# Patient Record
Sex: Female | Born: 1975
Health system: Southern US, Community
[De-identification: ages and names within clinical notes are randomized; demographics above are authoritative.]

---

## 2001-08-23 ENCOUNTER — Encounter: Payer: Self-pay | Admitting: *Deleted

## 2001-08-23 ENCOUNTER — Emergency Department (HOSPITAL_COMMUNITY): Admission: EM | Admit: 2001-08-23 | Discharge: 2001-08-23 | Payer: Self-pay | Admitting: *Deleted

## 2001-12-24 ENCOUNTER — Ambulatory Visit (HOSPITAL_COMMUNITY): Admission: RE | Admit: 2001-12-24 | Discharge: 2001-12-24 | Payer: Self-pay | Admitting: Pulmonary Disease

## 2001-12-30 ENCOUNTER — Ambulatory Visit (HOSPITAL_COMMUNITY): Admission: RE | Admit: 2001-12-30 | Discharge: 2001-12-30 | Payer: Self-pay | Admitting: Pulmonary Disease

## 2002-01-24 ENCOUNTER — Ambulatory Visit (HOSPITAL_COMMUNITY): Admission: RE | Admit: 2002-01-24 | Discharge: 2002-01-24 | Payer: Self-pay | Admitting: Internal Medicine

## 2003-03-09 ENCOUNTER — Other Ambulatory Visit: Admission: RE | Admit: 2003-03-09 | Discharge: 2003-03-09 | Payer: Self-pay | Admitting: Dermatology

## 2003-05-01 ENCOUNTER — Ambulatory Visit (HOSPITAL_COMMUNITY): Admission: RE | Admit: 2003-05-01 | Discharge: 2003-05-01 | Payer: Self-pay | Admitting: Internal Medicine

## 2003-05-01 ENCOUNTER — Encounter: Payer: Self-pay | Admitting: Internal Medicine

## 2004-02-26 ENCOUNTER — Ambulatory Visit (HOSPITAL_COMMUNITY): Admission: RE | Admit: 2004-02-26 | Discharge: 2004-02-26 | Payer: Self-pay | Admitting: Internal Medicine

## 2004-02-29 ENCOUNTER — Ambulatory Visit (HOSPITAL_COMMUNITY): Admission: RE | Admit: 2004-02-29 | Discharge: 2004-02-29 | Payer: Self-pay | Admitting: Internal Medicine

## 2004-03-01 ENCOUNTER — Ambulatory Visit (HOSPITAL_COMMUNITY): Admission: RE | Admit: 2004-03-01 | Discharge: 2004-03-01 | Payer: Self-pay | Admitting: Internal Medicine

## 2004-03-04 ENCOUNTER — Ambulatory Visit (HOSPITAL_COMMUNITY): Admission: RE | Admit: 2004-03-04 | Discharge: 2004-03-04 | Payer: Self-pay | Admitting: Internal Medicine

## 2004-11-29 ENCOUNTER — Other Ambulatory Visit: Admission: RE | Admit: 2004-11-29 | Discharge: 2004-11-29 | Payer: Self-pay | Admitting: Obstetrics & Gynecology

## 2004-12-18 HISTORY — PX: PARTIAL HYSTERECTOMY: SHX80

## 2005-03-09 ENCOUNTER — Encounter (INDEPENDENT_AMBULATORY_CARE_PROVIDER_SITE_OTHER): Payer: Self-pay | Admitting: *Deleted

## 2005-03-09 ENCOUNTER — Observation Stay (HOSPITAL_COMMUNITY): Admission: RE | Admit: 2005-03-09 | Discharge: 2005-03-10 | Payer: Self-pay | Admitting: Obstetrics & Gynecology

## 2006-08-27 ENCOUNTER — Ambulatory Visit: Payer: Self-pay | Admitting: Gastroenterology

## 2007-03-06 ENCOUNTER — Ambulatory Visit (HOSPITAL_COMMUNITY): Admission: RE | Admit: 2007-03-06 | Discharge: 2007-03-06 | Payer: Self-pay | Admitting: Obstetrics & Gynecology

## 2008-04-22 ENCOUNTER — Ambulatory Visit (HOSPITAL_COMMUNITY): Admission: RE | Admit: 2008-04-22 | Discharge: 2008-04-22 | Payer: Self-pay | Admitting: Family Medicine

## 2011-03-08 ENCOUNTER — Other Ambulatory Visit (HOSPITAL_COMMUNITY): Payer: Self-pay | Admitting: Family Medicine

## 2011-03-08 ENCOUNTER — Ambulatory Visit (HOSPITAL_COMMUNITY)
Admission: RE | Admit: 2011-03-08 | Discharge: 2011-03-08 | Disposition: A | Discharge status: Home / Self Care | Payer: BC Managed Care – PPO | Source: Ambulatory Visit | Attending: Family Medicine | Admitting: Family Medicine

## 2011-03-08 DIAGNOSIS — M792 Neuralgia and neuritis, unspecified: Secondary | ICD-10-CM

## 2011-03-08 DIAGNOSIS — M5020 Other cervical disc displacement, unspecified cervical region: Secondary | ICD-10-CM | POA: Insufficient documentation

## 2011-03-08 DIAGNOSIS — M542 Cervicalgia: Secondary | ICD-10-CM | POA: Insufficient documentation

## 2011-05-05 NOTE — Procedures (Signed)
NAMENIANNA, IGO                          ACCOUNT NO.:  [id number]   MEDICAL RECORD NO.:  [id number]                   PATIENT TYPE:  OUT   LOCATION:  RESP                                 FACILITY:  [facility]   PHYSICIAN:  Edward L. Juanetta Gosling, M.D.             DATE OF BIRTH:  [birth year]   DATE OF PROCEDURE:  DATE OF DISCHARGE:  03/04/2004                              PULMONARY FUNCTION TEST   IMPRESSION:  1. Spirometry is normal.  2. Lung volumes show questionable air trapping, but normal total lung     capacity.  3. DLCO is normal.  4. Arterial blood gases are normal.      ___________________________________________                                            Oneal Deputy. Juanetta Gosling, M.D.   ELH/MEDQ  D:  03/07/2004  T:  03/08/2004  Job:  [id number]   cc:   Madelin Rear. Sherwood Gambler, M.D.  [address]  [place]  [place] [address]  Fax: [phone]

## 2011-05-05 NOTE — H&P (Signed)
[name], [name]                ACCOUNT NO.:  [id number]   MEDICAL RECORD NO.:  [id number]          PATIENT TYPE:  AMB   LOCATION:  SDC                           FACILITY:  [facility]   PHYSICIAN:  Freddy Finner, M.D.   DATE OF BIRTH:  27-Nov-1976   DATE OF ADMISSION:  03/09/2005  DATE OF DISCHARGE:                                HISTORY & PHYSICAL   ADMITTING DIAGNOSES:  1.  Probable adenomyosis with myometrial thickening on sonohysterogram.  2.  Long history of menorrhagia, severe dysmenorrhea.  3.  History of abnormal Pap.   The patient is a 35 year old, white, married female, gravida 3, para 2, who  first presented to my office in September 2005.  At that time she complained  of chronic severe pelvic cramping pain and history of very heavy menses with  passage of large clots with menses.  By history this has been improved  somewhat with oral contraceptives but her regular medical doctor had asked  her to discontinue the oral contraceptives.  Clinically her examination in  the office was normal without any appreciable enlargement of the uterus or  palpable masses.  A sonohysterogram in the office, however, showed  myometrial thickening with the anterior wall 1.7-cm and posterior wall 2.1-  cm in thickness.  There was no intracavitary mass after a saline infusion.  The endometrium was thin at 1.6-mm.  Due to the persistence of her symptoms,  options of therapy have been discussed with the patient including  laparoscopy and NovaSure endometrial ablation.  She does not require  contraception having had a tubal ligation in the past.  She has requested  definitive surgical intervention and is admitted now for laparoscopic  assisted vaginal hysterectomy.   CURRENT REVIEW OF SYSTEMS:  There are no cardiopulmonary symptoms.  She does  complain of some GI distress with post prandial dumping characterized by  diarrhea after meals.  She has no other GU complaints.   PAST MEDICAL HISTORY:  No  known significant medical illnesses.   PREVIOUS SURGICAL PROCEDURES:  Tubal ligation done in 1998 after the birth  of her second child.   She has two living children.  She had one spontaneous miscarriage before  conceiving and delivering two viable infants.  She has never had a blood  transfusion.  She is not a cigarette smoker.  She is currently not on any  medications on a chronic basis.   FAMILY HISTORY:  Remarkable for bone cancer in her mother.  Her father has  cancer of un described type.   PHYSICAL EXAMINATION:  HEENT:  Grossly within normal limits.  Thyroid gland  is not palpably enlarged.  VITAL SIGNS:  Blood pressure in the office 110/76, height is 5 feet 4.5  inches, weight 138 pounds.  CHEST:  Clear to auscultation.  HEART:  Normal sinus rhythm without murmurs, rubs or gallops.  BREASTS:  Considered to be normal.  No palpable masses.  No skin change or  nipple discharge.  ABDOMEN:  Soft and nontender without appreciable organomegaly or palpable  masses.  PELVIC:  Normal external genitalia, vagina,  and cervix.  Clinically a  bimanual exam reveals no palpable abnormalities.  RECTAL:  The rectum is palpably normal and rectovaginal exam confirms these  findings. Sonohysterogram in the office did reveal thickening of the  anterior and posterior myometrial walls consistent with adenomyosis.  EXTREMITIES:  Without cyanosis, clubbing, or edema.   ASSESSMENT:  1.  Probable uterine adenomyosis.  2.  Clinical history of menorrhagia, severe dysmenorrhea.   PLAN:  Laparoscopic-assisted vaginal hysterectomy.  The patient have  reviewed a video describing operative procedure including potential risks of  the procedure.  She __________  to proceed.      WRN/MEDQ  D:  03/08/2005  T:  03/08/2005  Job:  [id number]

## 2011-05-05 NOTE — Discharge Summary (Signed)
[name], [name]                ACCOUNT NO.:  [id number]   MEDICAL RECORD NO.:  [id number]          PATIENT TYPE:  OBV   LOCATION:  9312                          FACILITY:  [facility]   PHYSICIAN:  Freddy Finner, M.D.   DATE OF BIRTH:  Aug 01, 1976   DATE OF ADMISSION:  03/09/2005  DATE OF DISCHARGE:                                 DISCHARGE SUMMARY   DISCHARGE DIAGNOSES:  1.  Uterine adenomyosis.  2.  Clinical symptoms of menorrhagia, severe dysmenorrhea.   OPERATIVE PROCEDURE:  Laparoscopically-assisted vaginal hysterectomy.   SURGEON:  Freddy Finner, M.D.   ASSISTANT:  Dineen Kid. Rana Snare, M.D.   INTRAOPERATIVE COMPLICATIONS:  None.   POSTOPERATIVE COMPLICATIONS:  None.   DISPOSITION:  The patient is in satisfactory and improved condition the time  of her discharge. She is to have progressively increasing physical activity  but no vaginal entry, no heavy lifting. She is to return to the office in  approximately 2 weeks for her first postoperative visit. She is to call for  fever or heavy bleeding. She is to take a regular diet. She is given  Percocet to be taken as needed for postoperative pain and can mix this with  ibuprofen.   Details of the present illness, past history, family history, review of  systems, physical exam are recorded in the admission note. The physical  findings on admission were primarily remarkable for ultrasound findings  showing thickening of the myometrium. Her history is remarkable for clinical  symptoms of very heavy menses and pelvic pain and dysmenorrhea.   Laboratory data during this admission includes preoperative hemoglobin 13.4,  postoperative hemoglobin on the first postoperative day 11.7. Prothrombin  time and PTT and urinalysis were all normal on admission.   HOSPITAL COURSE:  The patient was admitted on the morning of surgery. She  was treated perioperatively with PAS hose and with IV antibiotic. The above-  described operative procedure was  accomplished without intraoperative  difficulty. By the morning of the first postoperative day, her condition was  considered to be satisfactory. She was having adequate bowel and bladder  function. She was ambulating without assistance. She was considered to be in  good condition and was discharged home with disposition as noted above.      WRN/MEDQ  D:  03/10/2005  T:  03/10/2005  Job:  [id number]

## 2011-05-05 NOTE — Op Note (Signed)
[name], [name]                ACCOUNT NO.:  [id number]   MEDICAL RECORD NO.:  [id number]          PATIENT TYPE:  OBV   LOCATION:  9399                          FACILITY:  [facility]   PHYSICIAN:  Freddy Finner, M.D.   DATE OF BIRTH:  05/14/1976   DATE OF PROCEDURE:  03/09/2005  DATE OF DISCHARGE:                                 OPERATIVE REPORT   PREOPERATIVE DIAGNOSIS:  Probable uterine adenomyosis. Clinical symptoms of  menorrhagia, severe dysmenorrhea.   POSTOPERATIVE DIAGNOSIS:  Probable uterine adenomyosis. Clinical symptoms of  menorrhagia, severe dysmenorrhea.   OPERATIVE PROCEDURE:  Laparoscopic-assisted vaginal hysterectomy.   SURGEON:  Freddy Finner, M.D.   ASSISTANT:  Dineen Kid. Rana Snare, M.D.   ANESTHESIA:  General endotracheal.   ESTIMATED INTRAOPERATIVE BLOOD LOSS:  350 mL.   INTRAOPERATIVE COMPLICATIONS:  None.   Details of the present illness are recorded in the admission note. The  patient was admitted on the morning of surgery. She was given an IV  antibiotic bolus preoperatively. She was placed in PAS hose. She was brought  to the operating room, there placed under adequate general endotracheal  anesthesia, placed in the dorsal lithotomy position using the Codell stirrup  system. Betadine prep was carried out using scrub followed by solution. The  abdomen, perineum, and vagina were prepped. The bladder was evacuated with a  Robinson catheter. A Hulka tenaculum was attached to the cervix under direct  visualization. Sterile drapes were applied. Two small incisions were made in  the abdomen - one at the umbilicus, one just above the symphysis. Through  the umbilical incision, an 11 mm nonbladed disposable trocar was introduced  while elevating the anterior abdominal wall manually. Direct inspection  revealed adequate placement with no evidence of injury on entry.  Pneumoperitoneum was allowed to accumulate using carbon dioxide gas. A 5 mm  trocar was placed  through the lower incision under direct visualization.  Systematic examination of the pelvic and abdominal contents was carried out.  There were no apparent abnormalities of the upper abdomen including the  appendix. Tubes and ovaries were normal. The uterus was boggy and irregular  in contour and slightly enlarged. There was evidence of tubal ligation with  presence of Falope rings. Using the Gyrus tripolar device through the  operating channel of the laparoscope, the uteroovarian pedicles, round  ligaments, proximal fallopian tube, and upper broad ligament on each side  was progressively sealed and divided using the Gyrus device. This was  carried to a level just above the uterine arteries. Attention was then  turned vaginally. A posterior weighted vaginal retractor was placed. Hulka  tenaculum was replaced with a Jacobs tenaculum. Colpotomy incision was made  while tenting the mucosa posterior to the cervix. The cervix was  circumscribed with a scalpel to release the mucosa. The Gyrus Heaney-style  clamp was then used to seal and divide the uterosacrals, bladder pillars,  and cardinal ligaments. The anterior peritoneum was entered. Vessel pedicles  were taken with the Gyrus device, sealed, and divided. An additional pedicle  on each side was taken, sealed, and divided  which completely released the  uterus. This was delivered through the vaginal cuff. The angles of the  vagina were then anchored to the uterosacrals with mattress suture of 0  Monocryl. The posterior peritoneum was closed and uterosacrals plicated with  an interrupted 0 Monocryl suture. Bleeding source along the left  uteroovarian pedicle was sealed again with the Gyrus producing adequate  hemostasis. The cuff was closed vertically with figure-of-eights of 0  Monocryl. Foley catheter was placed. Reinspection abdominally using the  laparoscope revealed a little bit of oozing in two to three locations along  the dissected  surfaces. These were easily controlled with the Gyrus device.  Irrigation was carried out. Hemostasis was confirmed under irrigation and  reduced intraabdominal pressure. The irrigating solution was aspirated from  the abdomen. The Nezhat irrigation system was used for this procedure. All  instruments were removed, gas was allowed to escape from the abdomen. The  incisions were closed with interrupted subcuticular sutures of 3-0 Dexon.  Plain Marcaine 0.5% was injected through the incision sites for  postoperative analgesia. Steri-Strips were applied to the lower incision,  sterile dressing to the upper incision. The patient tolerated the procedure  well. She was awakened and taken to recovery in good condition.      WRN/MEDQ  D:  03/09/2005  T:  03/09/2005  Job:  [id number]

## 2012-08-01 ENCOUNTER — Other Ambulatory Visit (HOSPITAL_COMMUNITY): Payer: Self-pay | Admitting: Rheumatology

## 2012-08-01 ENCOUNTER — Ambulatory Visit (HOSPITAL_COMMUNITY)
Admission: RE | Admit: 2012-08-01 | Discharge: 2012-08-01 | Disposition: A | Discharge status: Home / Self Care | Payer: BC Managed Care – PPO | Source: Ambulatory Visit | Attending: Rheumatology | Admitting: Rheumatology

## 2012-08-01 DIAGNOSIS — R059 Cough, unspecified: Secondary | ICD-10-CM | POA: Insufficient documentation

## 2012-08-01 DIAGNOSIS — R05 Cough: Secondary | ICD-10-CM

## 2013-07-30 ENCOUNTER — Ambulatory Visit (INDEPENDENT_AMBULATORY_CARE_PROVIDER_SITE_OTHER): Payer: BC Managed Care – PPO | Admitting: Family Medicine

## 2013-07-30 ENCOUNTER — Encounter: Payer: Self-pay | Admitting: Family Medicine

## 2013-07-30 VITALS — BP 124/90 | Temp 98.7°F | Ht 62.0 in | Wt 166.2 lb

## 2013-07-30 DIAGNOSIS — IMO0001 Reserved for inherently not codable concepts without codable children: Secondary | ICD-10-CM

## 2013-07-30 DIAGNOSIS — J329 Chronic sinusitis, unspecified: Secondary | ICD-10-CM

## 2013-07-30 DIAGNOSIS — K219 Gastro-esophageal reflux disease without esophagitis: Secondary | ICD-10-CM

## 2013-07-30 DIAGNOSIS — M797 Fibromyalgia: Secondary | ICD-10-CM

## 2013-07-30 MED ORDER — AMOXICILLIN 500 MG PO TABS: ORAL_TABLET | ORAL | Status: DC

## 2013-07-30 MED ORDER — AMOXICILLIN 500 MG PO TABS: 500.0000 mg | ORAL_TABLET | Freq: Two times a day (BID) | ORAL | Status: DC

## 2013-07-30 NOTE — Patient Instructions (Signed)
May try otc omeprazole 20 mg daily for a month to help the cough

## 2013-07-30 NOTE — Progress Notes (Signed)
  Subjective:    Patient ID: [name], female    DOB: Apr 22, 1976, 37 y.o.   MRN: [id number]  Sinusitis This is a new problem. The problem has been gradually worsening since onset. The maximum temperature recorded prior to her arrival was 100 - 100.9 F. The fever has been present for 1 to 2 days. Her pain is at a severity of 4/10. The pain is moderate. Associated symptoms include headaches (frontal headaches). Past treatments include nothing.    Poor energy level,  Patient notes also chronic cough for the past year. Negative chest x-ray. Started first after meals with reflux-like symptoms. Review of Systems  Neurological: Positive for headaches (frontal headaches).   otherwise negative     Objective:   Physical Exam  Alert HEENT moderate nasal congestion. Lungs clear. Heart regular rate and rhythm. Pharynx normal neck supple. Vital stable.      Assessment & Plan:  Impression 1 rhinosinusitis. #2 chronic cough possibly related to reflux. Plan a mocks 500 3 times a day 10 days. Add over-the-counter omeprazole 20 mg daily. Expect gradual resolution. Symptomatic care discussed. WSL

## 2014-10-12 ENCOUNTER — Other Ambulatory Visit: Payer: Self-pay | Admitting: Obstetrics & Gynecology

## 2014-10-13 LAB — CYTOLOGY - PAP

## 2015-07-12 ENCOUNTER — Ambulatory Visit (INDEPENDENT_AMBULATORY_CARE_PROVIDER_SITE_OTHER): Payer: BLUE CROSS/BLUE SHIELD | Admitting: Family Medicine

## 2015-07-12 ENCOUNTER — Encounter: Payer: Self-pay | Admitting: Family Medicine

## 2015-07-12 VITALS — Temp 98.6°F | Ht 62.0 in | Wt 159.2 lb

## 2015-07-12 DIAGNOSIS — J329 Chronic sinusitis, unspecified: Secondary | ICD-10-CM

## 2015-07-12 MED ORDER — CLARITHROMYCIN 500 MG PO TABS: 500.0000 mg | ORAL_TABLET | Freq: Two times a day (BID) | ORAL | Status: AC

## 2015-07-12 NOTE — Progress Notes (Signed)
   Subjective:    Patient ID: [name], female    DOB: 12-Aug-1976, 39 y.o.   MRN: [id number]  Cough This is a new problem. Associated symptoms include headaches and myalgias.    Hit hard Saturday  Laid on the couch   Non prod cough  Cough last couple months   Low grade fever   Frontal and radiating   No hx of spring allergies  No hx of asthma but some remote wheezing   Takes ibuprofen off and on     Review of Systems  Respiratory: Positive for cough.   Musculoskeletal: Positive for myalgias.  Neurological: Positive for headaches.       Objective:   Physical Exam  Alert vitals stable HEENT moderate nasal congestion left maxillary tenderness. Slight swelling. Pharynx normal neck supple lungs clear bronchial cough heart regular in rhythm      Assessment & Plan:  Impression rhinosinusitis/bronchitis plan anti-bites prescribed. Symptomatic care discussed. I thin the visit patient mentioned back pain and achiness that has been going on for many many months. I advised patient we will be happy to discuss and contrast this. Will need to be a separate visit. Of note prior diagnosis of fibromyalgia mentioned by patient WSL

## 2017-07-05 ENCOUNTER — Ambulatory Visit (INDEPENDENT_AMBULATORY_CARE_PROVIDER_SITE_OTHER): Payer: BLUE CROSS/BLUE SHIELD | Admitting: Orthopaedic Surgery

## 2017-07-05 ENCOUNTER — Encounter (INDEPENDENT_AMBULATORY_CARE_PROVIDER_SITE_OTHER): Payer: Self-pay | Admitting: Orthopaedic Surgery

## 2017-07-05 ENCOUNTER — Ambulatory Visit (INDEPENDENT_AMBULATORY_CARE_PROVIDER_SITE_OTHER): Payer: Self-pay

## 2017-07-05 VITALS — BP 127/83 | HR 77 | Ht 64.0 in | Wt 150.0 lb

## 2017-07-05 DIAGNOSIS — M65311 Trigger thumb, right thumb: Secondary | ICD-10-CM | POA: Diagnosis not present

## 2017-07-05 DIAGNOSIS — M79644 Pain in right finger(s): Secondary | ICD-10-CM | POA: Diagnosis not present

## 2017-07-05 MED ORDER — LIDOCAINE HCL 1 % IJ SOLN
0.3000 mL | INTRAMUSCULAR | Status: AC | PRN
  Administered 2017-07-05: .3 mL

## 2017-07-05 MED ORDER — BUPIVACAINE HCL 0.25 % IJ SOLN
0.3300 mL | INTRAMUSCULAR | Status: AC | PRN
  Administered 2017-07-05: .33 mL

## 2017-07-05 MED ORDER — METHYLPREDNISOLONE ACETATE 40 MG/ML IJ SUSP
13.3300 mg | INTRAMUSCULAR | Status: AC | PRN
  Administered 2017-07-05: 13.33 mg

## 2017-07-05 NOTE — Progress Notes (Signed)
Office Visit Note   Patient: [name]           Date of Birth: [birth year]           MRN: [id number] Visit Date: 07/05/2017              Requested by: Merlyn AlbertLuking, William S, MD [address] [place], [place] [id number] PCP: Merlyn AlbertLuking, William S, MD   Assessment & Plan: Visit Diagnoses:  1. Thumb pain, right   2. Trigger thumb, right thumb     Plan: Right trigger thumb injection performed which show she tolerated well. She is able to bend her thumb with slight catching after the injection. Dorsal splint applied. If she has persistent triggering she'll call for trigger thumb release.   Follow-Up Instructions: No Follow-up on file.   Orders:  Orders Placed This Encounter  Procedures  . XR Finger Thumb Right   No orders of the defined types were placed in this encounter.     Procedures: Hand/UE Inj Date/Time: 07/05/2017 2:39 PM Performed by: Eldred MangesYATES, Renatha Rosen C Authorized by: Annell GreeningYATES, Anjelique Makar C   Condition: trigger finger   Location:  Thumb Site:  R thumb A1 Needle Size:  25 G Approach:  Volar Medications:  0.3 mL lidocaine 1 %; 0.33 mL bupivacaine 0.25 %; 13.33 mg methylPREDNISolone acetate 40 MG/ML     Clinical Data: No additional findings.   Subjective: Chief Complaint  Patient presents with  . 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 She states she has a pop in and out of [place] at night. She does not recall any specific injury to it.  Review of Systems   Objective: Vital Signs: BP 127/83   Pulse 77   Ht 5\' 4"  (1.626 m)   Wt 150 lb (68 kg)   BMI 25.75 kg/m   Physical Exam  Constitutional: She is oriented to person, place, and time. She appears well-developed.  HENT:  Head: Normocephalic.  Right Ear: External ear normal.  Left Ear: External ear normal.  Eyes: Pupils are equal, round, and reactive to light.  Neck:  No tracheal deviation present. No thyromegaly present.  Cardiovascular: Normal rate.   Pulmonary/Chest: Effort normal.  Abdominal: Soft.  Neurological: She is alert and oriented to person, place, and time.  Skin: Skin is warm and dry.  Psychiatric: She has a normal mood and affect. Her behavior is normal.    Ortho Exam exquisite tenderness over A1 pulley right thumb. She is unable to flex. Cessation fingertip is normal thenar is normal carpal tunnel exam is normal. Normal wrist range of motion.  Specialty Comments:  No specialty comments available.  Imaging: Xr Finger Thumb Right  Result Date: 07/05/2017 Three-view x-rays right thumb obtained. No arthritic changes no fractures no acute changes and normal alignment is noted. Impression normal right thumb and hand x-rays    PMFS History: Patient Active Problem List   Diagnosis Date Noted  . Fibromyalgia 07/30/2013  . Esophageal reflux 07/30/2013   History reviewed. No pertinent past medical history.  History reviewed. No pertinent family history.  Past Surgical History:  Procedure Laterality Date  . PARTIAL HYSTERECTOMY  2006   Social History   Occupational History  . Not on file.   Social History Main Topics  . Smoking status: Never Smoker  . Smokeless tobacco: Never Used  .  Alcohol use No  . Drug use: No  . Sexual activity: Not on file

## 2017-11-12 DIAGNOSIS — Z1329 Encounter for screening for other suspected endocrine disorder: Secondary | ICD-10-CM | POA: Diagnosis not present

## 2017-11-12 DIAGNOSIS — Z1231 Encounter for screening mammogram for malignant neoplasm of breast: Secondary | ICD-10-CM | POA: Diagnosis not present

## 2017-11-12 DIAGNOSIS — Z01419 Encounter for gynecological examination (general) (routine) without abnormal findings: Secondary | ICD-10-CM | POA: Diagnosis not present

## 2017-11-12 DIAGNOSIS — Z6826 Body mass index (BMI) 26.0-26.9, adult: Secondary | ICD-10-CM | POA: Diagnosis not present

## 2018-01-02 ENCOUNTER — Ambulatory Visit: Payer: BLUE CROSS/BLUE SHIELD | Admitting: Family Medicine

## 2018-01-02 ENCOUNTER — Encounter: Payer: Self-pay | Admitting: Family Medicine

## 2018-01-02 VITALS — BP 114/82 | Temp 98.1°F | Ht 62.0 in | Wt 158.0 lb

## 2018-01-02 DIAGNOSIS — J111 Influenza due to unidentified influenza virus with other respiratory manifestations: Secondary | ICD-10-CM

## 2018-01-02 MED ORDER — OSELTAMIVIR PHOSPHATE 75 MG PO CAPS: 75.0000 mg | ORAL_CAPSULE | Freq: Two times a day (BID) | ORAL | 0 refills | Status: DC

## 2018-01-02 NOTE — Progress Notes (Signed)
   Subjective:    Patient ID: Janelle FloorRhonda M Spickler, female    DOB: 11-03-1976, 42 y.o.   MRN: [id number]  HPI  Patient is here today with complaints of low grade fever,cough,sinus congestion,body aches since yesterday. She is taking Ibuprofen which is helping.  Last night started feeling very tired  Felt fever thru the nigh  Balance off some  Body aching bad  Throat  Hurting bad  Took flu shot   Works at [employer]   Head durting sometimes gets headache s   Feels like having to take dep beath      enrgy por  Feeling fever    Review of Systems Alert moderate malaise.  Hydration good.  No major weight loss or weight gain, no chest pain no back pain abdominal pain no change in bowel habits complete ROS otherwise negative     Objective:   Physical Exam Alert moderate malaise patient hydration decent TMs normal.  Some nasal congestion.  Pharynx normal neck supple lungs cough during exam heart regular rate and rhythm.    Impression attenuated flu plan Tamiflu twice daily for 5 days.  Symptom care discussed.  Warning signs discussed      Assessment & Plan:

## 2018-04-25 ENCOUNTER — Encounter (INDEPENDENT_AMBULATORY_CARE_PROVIDER_SITE_OTHER): Payer: Self-pay | Admitting: Orthopaedic Surgery

## 2018-04-25 ENCOUNTER — Ambulatory Visit (INDEPENDENT_AMBULATORY_CARE_PROVIDER_SITE_OTHER): Payer: BLUE CROSS/BLUE SHIELD | Admitting: Orthopaedic Surgery

## 2018-04-25 VITALS — BP 130/84 | HR 78 | Ht 62.0 in | Wt 155.0 lb

## 2018-04-25 DIAGNOSIS — R202 Paresthesia of skin: Secondary | ICD-10-CM

## 2018-04-25 DIAGNOSIS — M65311 Trigger thumb, right thumb: Secondary | ICD-10-CM

## 2018-04-25 DIAGNOSIS — R200 Anesthesia of skin: Secondary | ICD-10-CM

## 2018-04-25 NOTE — Progress Notes (Signed)
Office Visit Note   Patient: [name]           Date of Birth: [birth year]           MRN: [id number] Visit Date: 04/25/2018              Requested by: Merlyn Albert, MD [address] [place], [place] [address] PCP: Merlyn Albert, MD   Assessment & Plan: Visit Diagnoses:  1. Trigger thumb, right thumb   2. Numbness and tingling in right hand     Plan: Office follow-up after right hand nerve conduction velocities.  On return we will obtain AP and lateral C-spine x-ray.  With her thenar atrophy and physical exam findings for carpal tunnel she likely will require carpal tunnel release and trigger thumb release as an outpatient.  She would be out of work for approximately 3 weeks.  Wrist splint given that she can wear at night to help with her sleep.  Follow-Up Instructions: No follow-ups on file.   Orders:  No orders of the defined types were placed in this encounter.  No orders of the defined types were placed in this encounter.     Procedures: No procedures performed   Clinical Data: No additional findings.   Subjective: Chief Complaint  Patient presents with  . Right Thumb - Pain    HPI 42 year old female returns with recurrent triggering of her right thumb.  She is also had pain that wakes her up at night she has to shake her hand and she is noticed she is been dropping objects.  She denies any associated neck pain.  She works on a Animator during the day.  Thumb is been triggering since January and on for the last 30 days is been in extension with inability to flex.  Previous injection in July 2018 gave her several months of relief for her right trigger thumb.  She denies symptoms in the left hand.  She is noticed weakness in her  Right hand and is dropped objects.  Right hand wakes her up at night she has to shake her right hand and then is able to fall back to sleep.  Review of Systems positive for treatment previous treatment right trigger  thumb, fibromyalgia, esophageal reflux.  Right hand numbness.   Objective: Vital Signs: BP 130/84   Pulse 78   Ht  (1.575 m)   Wt 155 lb (70.3 kg)   BMI 28.35 kg/m   Physical Exam  Constitutional: She is oriented to person, place, and time. She appears well-developed.  HENT:  Head: Normocephalic.  Right Ear: External ear normal.  Left Ear: External ear normal.  Eyes: Pupils are equal, round, and reactive to light.  Neck: No tracheal deviation present. No thyromegaly present.  Cardiovascular: Normal rate.  Pulmonary/Chest: Effort normal.  Abdominal: Soft.  Neurological: She is alert and oriented to person, place, and time.  Skin: Skin is warm and dry.  Psychiatric: She has a normal mood and affect. Her behavior is normal.    Ortho Exam patient has hyperthenar weakness and weakness of the abductor on the right none on the left.  Pain with carpal compression positive Phalen's test.  Decreased sensation radial 3 and half digits.  Normal over the small finger.  No tenderness over the ulnar nerve at the elbow.  Median nerve in the forearm is normal no brachial plexus tenderness negative Spurling full cervical range of motion.  Specialty Comments:  No specialty comments  available.  Imaging: No results found.   PMFS History: Patient Active Problem List   Diagnosis Date Noted  . Trigger thumb, right thumb 07/05/2017  . Fibromyalgia 07/30/2013  . Esophageal reflux 07/30/2013   No past medical history on file.  No family history on file.  Past Surgical History:  Procedure Laterality Date  . PARTIAL HYSTERECTOMY  2006   Social History   Occupational History  . Not on file  Tobacco Use  . Smoking status: Never Smoker  . Smokeless tobacco: Never Used  Substance and Sexual Activity  . Alcohol use: No  . Drug use: No  . Sexual activity: Not on file

## 2018-04-25 NOTE — Addendum Note (Signed)
Addended by: Rogers Seeds on: 04/25/2018 03:04 PM   Modules accepted: Orders

## 2018-05-10 ENCOUNTER — Ambulatory Visit (INDEPENDENT_AMBULATORY_CARE_PROVIDER_SITE_OTHER): Payer: BLUE CROSS/BLUE SHIELD | Admitting: Physical Medicine and Rehabilitation

## 2018-05-10 ENCOUNTER — Encounter (INDEPENDENT_AMBULATORY_CARE_PROVIDER_SITE_OTHER): Payer: Self-pay | Admitting: Physical Medicine and Rehabilitation

## 2018-05-10 DIAGNOSIS — R202 Paresthesia of skin: Secondary | ICD-10-CM | POA: Diagnosis not present

## 2018-05-10 NOTE — Progress Notes (Signed)
.  Numeric Pain Rating Scale and Functional Assessment Average Pain 6   In the last MONTH (on 0-10 scale) has pain interfered with the following?  1. General activity like being  able to carry out your everyday physical activities such as walking, climbing stairs, carrying groceries, or moving a chair?  Rating(5)   

## 2018-05-16 NOTE — Procedures (Signed)
EMG & NCV Findings: Evaluation of the right median motor nerve showed reduced amplitude (4.3 mV).  All remaining nerves (as indicated in the following tables) were within normal limits.    All examined muscles (as indicated in the following table) showed no evidence of electrical instability.    Impression: Essentially NORMAL electrodiagnostic study of the right upper limb.    There is no significant electrodiagnostic evidence of nerve entrapment, brachial plexopathy or cervical radiculopathy.  As you know, purely sensory or demyelinating radiculopathies and chemical radiculitis may not be detected with this particular electrodiagnostic study.  This electrodiagnostic study cannot rule out small fiber polyneuropathy and dysesthesias from central pain sensitization syndromes such as fibromyalgia.  Myotomal referral pain from trigger points is also not excluded.   Recommendations: 1.  Follow-up with referring physician. 2.  Continue current management of symptoms. 3.  Continue use of resting splint at night-time and as needed during the day.  Nerve Conduction Studies Anti Sensory Summary Table   Stim Site NR Peak (ms) Norm Peak (ms) P-T Amp (V) Norm P-T Amp Site1 Site2 Delta-P (ms) Dist (cm) Vel (m/s) Norm Vel (m/s)  Right Median Acr Palm Anti Sensory (2nd Digit)  32.2C  Wrist    3.3 <3.6 32.6 >10 Wrist Palm 1.5 0.0    Palm    1.8 <2.0 38.9         Right Radial Anti Sensory (Base 1st Digit)  33.8C  Wrist    1.8 <3.1 36.3  Wrist Base 1st Digit 1.8 0.0    Right Ulnar Anti Sensory (5th Digit)  32.9C  Wrist    3.0 <3.7 25.5 >15.0 Wrist 5th Digit 3.0 14.0 47 >38   Motor Summary Table   Stim Site NR Onset (ms) Norm Onset (ms) O-P Amp (mV) Norm O-P Amp Site1 Site2 Delta-0 (ms) Dist (cm) Vel (m/s) Norm Vel (m/s)  Right Median Motor (Abd Poll Brev)  33.3C  Wrist    3.6 <4.2 *4.3 >5 Elbow Wrist 3.7 19.0 51 >50  Elbow    7.3  4.2         Right Ulnar Motor (Abd Dig Min)  32.9C  Wrist    2.5  <4.2 13.5 >3 B Elbow Wrist 2.7 18.0 67 >53  B Elbow    5.2  12.9  A Elbow B Elbow 1.2 9.0 75 >53  A Elbow    6.4  12.6          EMG   Side Muscle Nerve Root Ins Act Fibs Psw Amp Dur Poly Recrt Int Dennie Bible Comment  Right Abd Poll Brev Median C8-T1 Nml Nml Nml Nml Nml 0 Nml Nml   Right 1stDorInt Ulnar C8-T1 Nml Nml Nml Nml Nml 0 Nml Nml   Right PronatorTeres Median C6-7 Nml Nml Nml Nml Nml 0 Nml Nml   Right Biceps Musculocut C5-6 Nml Nml Nml Nml Nml 0 Nml Nml   Right Deltoid Axillary C5-6 Nml Nml Nml Nml Nml 0 Nml Nml     Nerve Conduction Studies Anti Sensory Left/Right Comparison   Stim Site L Lat (ms) R Lat (ms) L-R Lat (ms) L Amp (V) R Amp (V) L-R Amp (%) Site1 Site2 L Vel (m/s) R Vel (m/s) L-R Vel (m/s)  Median Acr Palm Anti Sensory (2nd Digit)  32.2C  Wrist  3.3   32.6  Wrist Palm     Palm  1.8   38.9        Radial Anti Sensory (Base 1st Digit)  33.8C  Wrist  1.8   36.3  Wrist Base 1st Digit     Ulnar Anti Sensory (5th Digit)  32.9C  Wrist  3.0   25.5  Wrist 5th Digit  47    Motor Left/Right Comparison   Stim Site L Lat (ms) R Lat (ms) L-R Lat (ms) L Amp (mV) R Amp (mV) L-R Amp (%) Site1 Site2 L Vel (m/s) R Vel (m/s) L-R Vel (m/s)  Median Motor (Abd Poll Brev)  33.3C  Wrist  3.6   *4.3  Elbow Wrist  51   Elbow  7.3   4.2        Ulnar Motor (Abd Dig Min)  32.9C  Wrist  2.5   13.5  B Elbow Wrist  67   B Elbow  5.2   12.9  A Elbow B Elbow  75   A Elbow  6.4   12.6           Waveforms:

## 2018-05-16 NOTE — Progress Notes (Signed)
[name] - 42 y.o. female MRN [id number]  Date of birth: 22-May-1976  Office Visit Note: Visit Date: 05/10/2018 PCP: Merlyn Albert, MD Referred by: Merlyn Albert, MD  Subjective: Chief Complaint  Patient presents with  . Right Hand - Pain, Weakness   HPI: Mrs. Tomlinson is a 42 year old right-hand-dominant female that comes in today at the request of Dr. Annell Greening for electrodiagnostic study of the right upper limb.  She has had trigger thumb problem since January and did have an injection that seemed to help for several months.  She reports pain and numbness in the right hand for several months with numbness in the fingers.  Today at least she does not endorse necessarily specific fingers that are more numb than others.  Dr. Marlene Bast notes suggest that this was more of the radial digits when he saw her.  He also reported on exam some decreased sensation in the median nerve distribution and thenar atrophy.  She does not have any prior electrodiagnostic studies.  She denies any specific radicular complaints.  She has been wearing a brace at night which she says is helped the numbness significantly.  Her biggest complaint is the use of the right thumb.  She also does carry a diagnosis of fibromyalgia.   ROS Otherwise per HPI.  Assessment & Plan: Visit Diagnoses:  1. Paresthesia of skin     Plan: No additional findings.  Impression: Essentially NORMAL electrodiagnostic study of the right upper limb.    There is no significant electrodiagnostic evidence of nerve entrapment, brachial plexopathy or cervical radiculopathy.  As you know, purely sensory or demyelinating radiculopathies and chemical radiculitis may not be detected with this particular electrodiagnostic study.  This electrodiagnostic study cannot rule out small fiber polyneuropathy and dysesthesias from central pain sensitization syndromes such as fibromyalgia.  Myotomal referral pain from trigger points is also not  excluded.   Recommendations: 1.  Follow-up with referring physician. 2.  Continue current management of symptoms. 3.  Continue use of resting splint at night-time and as needed during the day.    Meds & Orders: No orders of the defined types were placed in this encounter.   Orders Placed This Encounter  Procedures  . NCV with EMG (electromyography)    Follow-up: Return for Dr. Annell Greening.   Procedures: No procedures performed  EMG & NCV Findings: Evaluation of the right median motor nerve showed reduced amplitude (4.3 mV).  All remaining nerves (as indicated in the following tables) were within normal limits.    All examined muscles (as indicated in the following table) showed no evidence of electrical instability.    Impression: Essentially NORMAL electrodiagnostic study of the right upper limb.    There is no significant electrodiagnostic evidence of nerve entrapment, brachial plexopathy or cervical radiculopathy.  As you know, purely sensory or demyelinating radiculopathies and chemical radiculitis may not be detected with this particular electrodiagnostic study.  This electrodiagnostic study cannot rule out small fiber polyneuropathy and dysesthesias from central pain sensitization syndromes such as fibromyalgia.  Myotomal referral pain from trigger points is also not excluded.   Recommendations: 1.  Follow-up with referring physician. 2.  Continue current management of symptoms. 3.  Continue use of resting splint at night-time and as needed during the day.  Nerve Conduction Studies Anti Sensory Summary Table   Stim Site NR Peak (ms) Norm Peak (ms) P-T Amp (V) Norm P-T Amp Site1 Site2 Delta-P (ms) Dist (cm) Vel (m/s) Norm Vel (m/s)  Right Median Acr Palm Anti Sensory (2nd Digit)  32.2C  Wrist    3.3 <3.6 32.6 >10 Wrist Palm 1.5 0.0    Palm    1.8 <2.0 38.9         Right Radial Anti Sensory (Base 1st Digit)  33.8C  Wrist    1.8 <3.1 36.3  Wrist Base 1st Digit 1.8 0.0     Right Ulnar Anti Sensory (5th Digit)  32.9C  Wrist    3.0 <3.7 25.5 >15.0 Wrist 5th Digit 3.0 14.0 47 >38   Motor Summary Table   Stim Site NR Onset (ms) Norm Onset (ms) O-P Amp (mV) Norm O-P Amp Site1 Site2 Delta-0 (ms) Dist (cm) Vel (m/s) Norm Vel (m/s)  Right Median Motor (Abd Poll Brev)  33.3C  Wrist    3.6 <4.2 *4.3 >5 Elbow Wrist 3.7 19.0 51 >50  Elbow    7.3  4.2         Right Ulnar Motor (Abd Dig Min)  32.9C  Wrist    2.5 <4.2 13.5 >3 B Elbow Wrist 2.7 18.0 67 >53  B Elbow    5.2  12.9  A Elbow B Elbow 1.2 9.0 75 >53  A Elbow    6.4  12.6          EMG   Side Muscle Nerve Root Ins Act Fibs Psw Amp Dur Poly Recrt Int Dennie Bible Comment  Right Abd Poll Brev Median C8-T1 Nml Nml Nml Nml Nml 0 Nml Nml   Right 1stDorInt Ulnar C8-T1 Nml Nml Nml Nml Nml 0 Nml Nml   Right PronatorTeres Median C6-7 Nml Nml Nml Nml Nml 0 Nml Nml   Right Biceps Musculocut C5-6 Nml Nml Nml Nml Nml 0 Nml Nml   Right Deltoid Axillary C5-6 Nml Nml Nml Nml Nml 0 Nml Nml     Nerve Conduction Studies Anti Sensory Left/Right Comparison   Stim Site L Lat (ms) R Lat (ms) L-R Lat (ms) L Amp (V) R Amp (V) L-R Amp (%) Site1 Site2 L Vel (m/s) R Vel (m/s) L-R Vel (m/s)  Median Acr Palm Anti Sensory (2nd Digit)  32.2C  Wrist  3.3   32.6  Wrist Palm     Palm  1.8   38.9        Radial Anti Sensory (Base 1st Digit)  33.8C  Wrist  1.8   36.3  Wrist Base 1st Digit     Ulnar Anti Sensory (5th Digit)  32.9C  Wrist  3.0   25.5  Wrist 5th Digit  47    Motor Left/Right Comparison   Stim Site L Lat (ms) R Lat (ms) L-R Lat (ms) L Amp (mV) R Amp (mV) L-R Amp (%) Site1 Site2 L Vel (m/s) R Vel (m/s) L-R Vel (m/s)  Median Motor (Abd Poll Brev)  33.3C  Wrist  3.6   *4.3  Elbow Wrist  51   Elbow  7.3   4.2        Ulnar Motor (Abd Dig Min)  32.9C  Wrist  2.5   13.5  B Elbow Wrist  67   B Elbow  5.2   12.9  A Elbow B Elbow  75   A Elbow  6.4   12.6           Waveforms:            Clinical History: No specialty  comments available.   She reports that she has never smoked. She has never used  smokeless tobacco. No results for input(s): HGBA1C, LABURIC in the last 8760 hours.  Objective:  VS:  HT:    WT:   BMI:     BP:   HR: bpm  TEMP: ( )  RESP:  Physical Exam  Musculoskeletal:  Inspection reveals flattening of the right APB but no frank atrophy and no atrophy of the bilateral FDI or hand intrinsics. There is no swelling, color changes dystrophic changes.  There seems to be some allodynia.  There is 5 out of 5 strength in the bilateral wrist extension, finger abduction and long finger flexion. There is intact sensation to light touch in all dermatomal and peripheral nerve distributions. There is a equivocally positive Phalen's test on the right but it causes more pain.. There is a negative Hoffmann's test bilaterally.    Ortho Exam Imaging: No results found.  Past Medical/Family/Surgical/Social History: Medications & Allergies reviewed per EMR, new medications updated. Patient Active Problem List   Diagnosis Date Noted  . Trigger thumb, right thumb 07/05/2017  . Fibromyalgia 07/30/2013  . Esophageal reflux 07/30/2013   No past medical history on file. No family history on file. Past Surgical History:  Procedure Laterality Date  . PARTIAL HYSTERECTOMY  2006   Social History   Occupational History  . Not on file  Tobacco Use  . Smoking status: Never Smoker  . Smokeless tobacco: Never Used  Substance and Sexual Activity  . Alcohol use: No  . Drug use: No  . Sexual activity: Not on file

## 2018-06-06 ENCOUNTER — Ambulatory Visit (INDEPENDENT_AMBULATORY_CARE_PROVIDER_SITE_OTHER): Payer: Self-pay

## 2018-06-06 ENCOUNTER — Ambulatory Visit (INDEPENDENT_AMBULATORY_CARE_PROVIDER_SITE_OTHER): Payer: BLUE CROSS/BLUE SHIELD | Admitting: Orthopaedic Surgery

## 2018-06-06 ENCOUNTER — Encounter (INDEPENDENT_AMBULATORY_CARE_PROVIDER_SITE_OTHER): Payer: Self-pay | Admitting: Orthopaedic Surgery

## 2018-06-06 VITALS — BP 135/93 | HR 80 | Ht 62.0 in | Wt 160.0 lb

## 2018-06-06 DIAGNOSIS — M65311 Trigger thumb, right thumb: Secondary | ICD-10-CM | POA: Diagnosis not present

## 2018-06-06 DIAGNOSIS — M542 Cervicalgia: Secondary | ICD-10-CM | POA: Diagnosis not present

## 2018-06-10 ENCOUNTER — Encounter (INDEPENDENT_AMBULATORY_CARE_PROVIDER_SITE_OTHER): Payer: Self-pay | Admitting: Orthopaedic Surgery

## 2018-06-10 NOTE — Progress Notes (Signed)
Office Visit Note   Patient: [name]           Date of Birth: 10-Jan-1976           MRN: [id number] Visit Date: 06/06/2018              Requested by: Merlyn Albert, MD [address] [place], [place] [address] PCP: Merlyn Albert, MD   Assessment & Plan: Visit Diagnoses:  1. Neck pain   2. Trigger thumb, right thumb     Plan: Thumb is been locked in extension on her right hand.  She has trigger thumb previous injection July 2018 that worked for many months and now her thumb is been locked for greater than 2 months.  Plan outpatient trigger thumb release for ongoing symptoms.  Surgery was discussed.  IV sedation local anesthesia outpatient procedure.  Resume work activity in about a week.  Questions were elicited and answered.  Patient requests we proceed.  Follow-Up Instructions: No follow-ups on file.   Orders:  Orders Placed This Encounter  Procedures  . XR Cervical Spine 2 or 3 views   No orders of the defined types were placed in this encounter.     Procedures: No procedures performed   Clinical Data: No additional findings.   Subjective: Chief Complaint  Patient presents with  . Right Arm - Pain, Numbness, Follow-up    EMG/NCS review  . Left Arm - Pain, Numbness, Follow-up    HPI 42 year old female returns with ongoing problems with right thumb triggering and numbness in both hands.  EMGs nerve conduction velocities have been obtained and are negative for carpal tunnel syndrome negative for radiculopathy.  She is unable to flex her thumb which is been present for several months.  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  She had previous triggering in the past but now her thumb is unable to flex.  Problems with gripping objects opening doors whenever she uses her right hand has had to use her left hand for these activities.  Review of Systems 14 point review of systems updated positive for esophageal reflux hand numbness, right trigger thumb  which is currently locked.  She had previous injection trigger thumb July 2018 with relief for several months and now her thumb is stuck and  unable to flex   Objective: Vital Signs: BP (!) 135/93   Pulse 80   Ht 5\' 2"  (1.575 m)   Wt 160 lb (72.6 kg)   BMI 29.26 kg/m   Physical Exam  Constitutional: She is oriented to person, place, and time. She appears well-developed.  HENT:  Head: Normocephalic.  Right Ear: External ear normal.  Left Ear: External ear normal.  Eyes: Pupils are equal, round, and reactive to light.  Neck: No tracheal deviation present. No thyromegaly present.  Cardiovascular: Normal rate.  Pulmonary/Chest: Effort normal.  Abdominal: Soft.  Neurological: She is alert and oriented to person, place, and time.  Skin: Skin is warm and dry.  Psychiatric: She has a normal mood and affect. Her behavior is normal.    Ortho Exam patient has mild thenar atrophy of her right hand.  Extreme tenderness over the A1 pulley her thumb is locked in extension 90 able to flex.  With attempted flexion she has sharp pain over the A1 pulley.  Two-point sensation index and long finger is normal.  No hyperthenar atrophy opposite hand shows no triggering of any digits and negative Phalen's test.  Negative  Spurling.  Specialty Comments:  No specialty comments available.  Imaging: AP and lateral C-spine x-rays were obtained and reviewed.  This was negative for acute changes.  There is mild straightening of the cervical spine.  No spondylolisthesis.  Disc spaces are maintained.   PMFS History: Patient Active Problem List   Diagnosis Date Noted  . Trigger thumb, right thumb 07/05/2017  . Fibromyalgia 07/30/2013  . Esophageal reflux 07/30/2013   History reviewed. No pertinent past medical history.  History reviewed. No pertinent family history.  Past Surgical History:  Procedure Laterality Date  . PARTIAL HYSTERECTOMY  2006   Social History   Occupational History  . Not on file    Tobacco Use  . Smoking status: Never Smoker  . Smokeless tobacco: Never Used  Substance and Sexual Activity  . Alcohol use: No  . Drug use: No  . Sexual activity: Not on file

## 2018-07-08 DIAGNOSIS — M65311 Trigger thumb, right thumb: Secondary | ICD-10-CM

## 2018-07-18 ENCOUNTER — Telehealth (INDEPENDENT_AMBULATORY_CARE_PROVIDER_SITE_OTHER): Payer: Self-pay | Admitting: Orthopaedic Surgery

## 2018-07-18 NOTE — Telephone Encounter (Signed)
Please call [name] and advise. Thank you.

## 2018-07-18 NOTE — Telephone Encounter (Signed)
Spoke w pt who has appt 07/24/18 to be released for work

## 2018-07-18 NOTE — Telephone Encounter (Signed)
Sherry from [employer] left a voicemail requesting that Dr. Cleophas DunkerWhitfield please fax a "Release to return to work" for patient to our office.  Fax # [phone]  If you have any questions, please call #[phone]

## 2018-07-24 ENCOUNTER — Encounter (INDEPENDENT_AMBULATORY_CARE_PROVIDER_SITE_OTHER): Payer: Self-pay | Admitting: Orthopaedic Surgery

## 2018-07-24 ENCOUNTER — Ambulatory Visit (INDEPENDENT_AMBULATORY_CARE_PROVIDER_SITE_OTHER): Payer: BLUE CROSS/BLUE SHIELD | Admitting: Orthopaedic Surgery

## 2018-07-24 VITALS — BP 122/80 | Ht 63.0 in | Wt 160.0 lb

## 2018-07-24 DIAGNOSIS — M65311 Trigger thumb, right thumb: Secondary | ICD-10-CM

## 2018-07-24 NOTE — Progress Notes (Signed)
   Office Visit Note   Patient: [name]           Date of Birth: 1976/08/26           MRN: [id number] Visit Date: 07/24/2018              Requested by: Merlyn AlbertLuking, William S, MD [address] [place], [place] [id number] PCP: Merlyn AlbertLuking, William S, MD   Assessment & Plan: Visit Diagnoses:  1. Trigger thumb, right thumb     Plan: 17 days status post release of right trigger thumb per Dr. Ophelia CharterYates.  Doing well.  Husband remove stitches 1 August.  Has some mild swelling and some mild loss of motion of the MP and IP joints.  Will work on range of motion and check with Dr. Ophelia CharterYates in 1 week  Follow-Up Instructions: Return in about 1 week (around 07/31/2018), or Dr Ophelia CharterYates.   Orders:  No orders of the defined types were placed in this encounter.  No orders of the defined types were placed in this encounter.     Procedures: No procedures performed   Clinical Data: No additional findings.   Subjective: Chief Complaint  Patient presents with  . Follow-up    07/08/18 R TRIGGER THUMB RELEASE HUSBAND REMOVED STITCHES 07/18/18  No related fever or chills.  No related numbness of right thumb  HPI  Review of Systems  Constitutional: Negative for fatigue and fever.  HENT: Negative for ear pain.   Eyes: Negative for pain.  Respiratory: Negative for cough and shortness of breath.   Cardiovascular: Negative for leg swelling.  Gastrointestinal: Negative for constipation and diarrhea.  Genitourinary: Negative for difficulty urinating.  Musculoskeletal: Negative for back pain and neck pain.  Skin: Negative for rash.  Allergic/Immunologic: Negative for food allergies.  Neurological: Positive for weakness. Negative for numbness.  Hematological: Does not bruise/bleed easily.  Psychiatric/Behavioral: Negative for sleep disturbance.     Objective: Vital Signs: BP 122/80 (BP Location: Left Arm, Patient Position: Sitting, Cuff Size: Normal)   Ht 5\' 3"  (1.6 m)   Wt 160 lb (72.6 kg)    BMI 28.34 kg/m   Physical Exam  Ortho Exam awake alert and oriented x3.  Comfortable sitting.  Incision at the base of the right thumb on the volar surface is healing without problem.  Normal motor and sensory exam.  Some mild swelling of the digit with some loss of motion at the IP and MP joint related to the swelling  Specialty Comments:  No specialty comments available.  Imaging: No results found.   PMFS History: Patient Active Problem List   Diagnosis Date Noted  . Trigger thumb, right thumb 07/05/2017  . Fibromyalgia 07/30/2013  . Esophageal reflux 07/30/2013   History reviewed. No pertinent past medical history.  History reviewed. No pertinent family history.  Past Surgical History:  Procedure Laterality Date  . PARTIAL HYSTERECTOMY  2006   Social History   Occupational History  . Not on file  Tobacco Use  . Smoking status: Never Smoker  . Smokeless tobacco: Never Used  Substance and Sexual Activity  . Alcohol use: No  . Drug use: No  . Sexual activity: Not on file

## 2018-08-08 ENCOUNTER — Ambulatory Visit (INDEPENDENT_AMBULATORY_CARE_PROVIDER_SITE_OTHER): Payer: BLUE CROSS/BLUE SHIELD | Admitting: Orthopaedic Surgery

## 2018-08-08 ENCOUNTER — Encounter (INDEPENDENT_AMBULATORY_CARE_PROVIDER_SITE_OTHER): Payer: Self-pay | Admitting: Orthopaedic Surgery

## 2018-08-08 VITALS — BP 138/97 | HR 69 | Ht 63.0 in | Wt 160.0 lb

## 2018-08-08 DIAGNOSIS — M65311 Trigger thumb, right thumb: Secondary | ICD-10-CM

## 2018-08-08 NOTE — Progress Notes (Signed)
Postop trigger thumb release on the right.  She has some difficulty reaching full extension and it had her fingertip flexed for several months.  We discussed some passive stretching to get symmetrical extension she flexes at the IP joint almost at 90.  Incision looks good she has slight tenderness at the incision.  Follow-up PRN.

## 2018-08-15 ENCOUNTER — Inpatient Hospital Stay (INDEPENDENT_AMBULATORY_CARE_PROVIDER_SITE_OTHER): Payer: BLUE CROSS/BLUE SHIELD | Admitting: Orthopaedic Surgery

## 2018-11-06 ENCOUNTER — Telehealth: Payer: Self-pay | Admitting: Family Medicine

## 2018-11-06 ENCOUNTER — Ambulatory Visit: Payer: BLUE CROSS/BLUE SHIELD | Admitting: Family Medicine

## 2018-11-06 ENCOUNTER — Ambulatory Visit (HOSPITAL_COMMUNITY)
Admission: RE | Admit: 2018-11-06 | Discharge: 2018-11-06 | Disposition: A | Discharge status: Home / Self Care | Payer: BLUE CROSS/BLUE SHIELD | Source: Ambulatory Visit | Attending: Family Medicine | Admitting: Family Medicine

## 2018-11-06 ENCOUNTER — Encounter: Payer: Self-pay | Admitting: Family Medicine

## 2018-11-06 VITALS — BP 132/88 | Temp 98.1°F | Ht 63.0 in | Wt 160.0 lb

## 2018-11-06 DIAGNOSIS — M25552 Pain in left hip: Secondary | ICD-10-CM

## 2018-11-06 DIAGNOSIS — Z13220 Encounter for screening for lipoid disorders: Secondary | ICD-10-CM | POA: Diagnosis not present

## 2018-11-06 DIAGNOSIS — R5383 Other fatigue: Secondary | ICD-10-CM

## 2018-11-06 DIAGNOSIS — Z79899 Other long term (current) drug therapy: Secondary | ICD-10-CM | POA: Diagnosis not present

## 2018-11-06 DIAGNOSIS — R05 Cough: Secondary | ICD-10-CM

## 2018-11-06 DIAGNOSIS — R059 Cough, unspecified: Secondary | ICD-10-CM

## 2018-11-06 DIAGNOSIS — R0602 Shortness of breath: Secondary | ICD-10-CM | POA: Diagnosis not present

## 2018-11-06 MED ORDER — ETODOLAC 400 MG PO TABS: ORAL_TABLET | ORAL | 0 refills | Status: DC

## 2018-11-06 NOTE — Progress Notes (Signed)
   Subjective:    Patient ID: [name], female    DOB: June 11, 1976, 42 y.o.   MRN: [id number]  Cough  This is a chronic problem. Episode onset: years ago off and on. Pertinent negatives include no shortness of breath. She has tried nothing for the symptoms.   Pt has had recurret cough off and on for years  Left hip pain. Off and on for months. Worse the last couple of days. Radiates down left leg. Pt states it causes her to have sob. Last couple of days having some nausea.   Pt feelig very tired  Works at the office  No hx of smoking    When first started recalls no injury or overuse    Notes tirednes and fatigue worsene over the past  No fam hx of severe or rheum arthritis  Pt notes chronic cough off and on, sometimes gets some stuff up, notes occas hx of post nasal drip but not often    Has noticed some limp when walking, partic upstairs  Has taken tylenol, didn't do much         Review of Systems  Respiratory: Positive for cough. Negative for shortness of breath.        Objective:   Physical Exam Alert and oriented, vitals reviewed and stable, NAD ENT-TM's and ext canals WNL bilat via otoscopic exam Soft palate, tonsils and post pharynx WNL via oropharyngeal exam Neck-symmetric, no masses; thyroid nonpalpable and nontender Pulmonary-no tachypnea or accessory muscle use; Clear without wheezes via auscultation Card--no abnrml murmurs, rhythm reg and rate WNL Carotid pulses symmetric, without bruits Positive pain with rotation of hip./Moderate tenderness lateral hip /Negative straight leg raise.      Assessment & Plan:  Impression 1 chronic cough.  Etiology unclear.  Off and on literally for years.  Frustrating to the patient.  Time for work-up.  Pulmonary referral.  Also chest x-ray  2.  Pain.  Highly doubt sciatica.  Hopefully elements of bursitis some tenderness lateral hip/will x-ray to assess intra-articular.  [place].  3.  Fatigue.   Nonspecific.  Long-standing.  Will evaluate blood work further recommendations based on results  No chest x-ray and hip x-ray negative.  To proceed with pulmonary referral.  Blood work discussed with patient

## 2018-11-06 NOTE — Telephone Encounter (Signed)
[facility] to get [facility] on the pts etodolac (LODINE) 400 MG tablet. No directions where sent over with order. CB# [id number].

## 2018-11-06 NOTE — Patient Instructions (Signed)
Trochanteric Bursitis Trochanteric bursitis is a condition that causes hip pain. Trochanteric bursitis happens when fluid-filled sacs (bursae) in the hip get irritated. Normally these sacs absorb shock and help strong bands of tissue (tendons) in your hip glide smoothly over each other and over your hip bones. What are the causes? This condition results from increased friction between the hip bones and the tendons that go over them. This condition can happen if you:  Have weak hips.  Use your hip muscles too much (overuse).  Get hit in the hip.  What increases the risk? This condition is more likely to develop in:  Women.  Adults who are middle-aged or older.  People with arthritis or a spinal condition.  People with weak buttocks muscles (gluteal muscles).  People who have one leg that is shorter than the other.  People who participate in certain kinds of athletic activities, such as: ? Running sports, especially long-distance running. ? Contact sports, like football or martial arts. ? Sports in which falls may occur, like skiing.  What are the signs or symptoms? The main symptom of this condition is pain and tenderness over the point of your hip. The pain may be:  Sharp and intense.  Dull and achy.  Felt on the outside of your thigh.  It may increase when you:  Lie on your side.  Walk or run.  Go up on stairs.  Sit.  Stand up after sitting.  Stand for long periods of time.  How is this diagnosed? This condition may be diagnosed based on:  Your symptoms.  Your medical history.  A physical exam.  Imaging tests, such as: ? X-rays to check your bones. ? An MRI or ultrasound to check your tendons and muscles.  During your physical exam, your health care provider will check the movement and strength of your hip. He or she may press on the point of your hip to check for pain. How is this treated? This condition may be treated by:  Resting.  Reducing  your activity.  Avoiding activities that cause pain.  Using crutches, a cane, or a walker to decrease the strain on your hip.  Taking medicine to help with swelling.  Having medicine injected into the bursae to help with swelling.  Using ice, heat, and massage therapy for pain relief.  Physical therapy exercises for strength and flexibility.  Surgery (rare).  Follow these instructions at home: Activity  Rest.  Avoid activities that cause pain.  Return to your normal activities as told by your health care provider. Ask your health care provider what activities are safe for you. Managing pain, stiffness, and swelling  Take over-the-counter and prescription medicines only as told by your health care provider.  If directed, apply heat to the injured area as told by your health care provider. ? Place a towel between your skin and the heat source. ? Leave the heat on for 20-30 minutes. ? Remove the heat if your skin turns bright red. This is especially important if you are unable to feel pain, heat, or cold. You may have a greater risk of getting burned.  If directed, apply ice to the injured area: ? Put ice in a plastic bag. ? Place a towel between your skin and the bag. ? Leave the ice on for 20 minutes, 2-3 times a day. General instructions  If the affected leg is one that you use for driving, ask your health care provider when it is safe to drive.    Use crutches, a cane, or a walker as told by your health care provider.  If one of your legs is shorter than the other, get fitted for a shoe insert.  Lose weight if you are overweight. How is this prevented?  Wear supportive footwear that is appropriate for your sport.  If you have hip pain, start any new exercise or sport slowly.  Maintain physical fitness, including: ? Strength. ? Flexibility. Contact a health care provider if:  Your pain does not improve with 2-4 weeks. Get help right away if:  You develop  severe pain.  You have a fever.  You develop increased redness over your hip.  You have a change in your bowel function or bladder function.  You cannot control the muscles in your feet. This information is not intended to replace advice given to you by your health care provider. Make sure you discuss any questions you have with your health care provider. Document Released: 01/11/2005 Document Revised: 08/09/2016 Document Reviewed: 11/19/2015 Elsevier Interactive Patient Education  2018 [employer]  

## 2018-11-06 NOTE — Telephone Encounter (Signed)
Pharmacy ([facility]) aware. We have resubmitted for BID.

## 2018-11-07 LAB — CBC WITH DIFFERENTIAL/PLATELET
Basophils Absolute: 0.1 10*3/uL (ref 0.0–0.2)
Basos: 1 %
EOS (ABSOLUTE): 0.1 10*3/uL (ref 0.0–0.4)
EOS: 1 %
Hematocrit: 42.7 % (ref 34.0–46.6)
Hemoglobin: 14.6 g/dL (ref 11.1–15.9)
Immature Grans (Abs): 0 10*3/uL (ref 0.0–0.1)
Immature Granulocytes: 0 %
LYMPHS ABS: 2 10*3/uL (ref 0.7–3.1)
Lymphs: 32 %
MCH: 30.2 pg (ref 26.6–33.0)
MCHC: 34.2 g/dL (ref 31.5–35.7)
MCV: 88 fL (ref 79–97)
MONOCYTES: 7 %
Monocytes Absolute: 0.4 10*3/uL (ref 0.1–0.9)
NEUTROS ABS: 3.7 10*3/uL (ref 1.4–7.0)
Neutrophils: 59 %
Platelets: 319 10*3/uL (ref 150–450)
RBC: 4.83 x10E6/uL (ref 3.77–5.28)
RDW: 11.9 % — ABNORMAL LOW (ref 12.3–15.4)
WBC: 6.2 10*3/uL (ref 3.4–10.8)

## 2018-11-07 LAB — BASIC METABOLIC PANEL
BUN / CREAT RATIO: 11 (ref 9–23)
BUN: 8 mg/dL (ref 6–24)
CO2: 22 mmol/L (ref 20–29)
CREATININE: 0.76 mg/dL (ref 0.57–1.00)
Calcium: 9.2 mg/dL (ref 8.7–10.2)
Chloride: 100 mmol/L (ref 96–106)
GFR calc Af Amer: 111 mL/min/{1.73_m2} (ref >59)
GFR calc non Af Amer: 96 mL/min/{1.73_m2} (ref >59)
GLUCOSE: 112 mg/dL — AB (ref 65–99)
Potassium: 4.6 mmol/L (ref 3.5–5.2)
Sodium: 137 mmol/L (ref 134–144)

## 2018-11-07 LAB — LIPID PANEL
CHOLESTEROL TOTAL: 192 mg/dL (ref 100–199)
Chol/HDL Ratio: 4.4 ratio (ref 0.0–4.4)
HDL: 44 mg/dL (ref >39)
LDL CALC: 124 mg/dL — AB (ref 0–99)
TRIGLYCERIDES: 120 mg/dL (ref 0–149)
VLDL Cholesterol Cal: 24 mg/dL (ref 5–40)

## 2018-11-07 LAB — HEPATIC FUNCTION PANEL
ALBUMIN: 4.5 g/dL (ref 3.5–5.5)
ALT: 14 IU/L (ref 0–32)
AST: 19 IU/L (ref 0–40)
Alkaline Phosphatase: 62 IU/L (ref 39–117)
Bilirubin Total: 0.4 mg/dL (ref 0.0–1.2)
Bilirubin, Direct: 0.1 mg/dL (ref 0.00–0.40)
Total Protein: 7.4 g/dL (ref 6.0–8.5)

## 2018-11-07 LAB — TSH: TSH: 1.57 u[IU]/mL (ref 0.450–4.500)

## 2018-11-13 ENCOUNTER — Encounter: Payer: Self-pay | Admitting: Family Medicine

## 2018-11-19 DIAGNOSIS — Z6827 Body mass index (BMI) 27.0-27.9, adult: Secondary | ICD-10-CM | POA: Diagnosis not present

## 2018-11-19 DIAGNOSIS — Z1231 Encounter for screening mammogram for malignant neoplasm of breast: Secondary | ICD-10-CM | POA: Diagnosis not present

## 2018-11-19 DIAGNOSIS — Z01419 Encounter for gynecological examination (general) (routine) without abnormal findings: Secondary | ICD-10-CM | POA: Diagnosis not present

## 2019-02-13 ENCOUNTER — Encounter: Payer: Self-pay | Admitting: Family Medicine

## 2019-02-13 ENCOUNTER — Ambulatory Visit: Payer: BLUE CROSS/BLUE SHIELD | Admitting: Family Medicine

## 2019-02-13 ENCOUNTER — Other Ambulatory Visit (HOSPITAL_COMMUNITY)
Admission: RE | Admit: 2019-02-13 | Discharge: 2019-02-13 | Disposition: A | Discharge status: Home / Self Care | Payer: BLUE CROSS/BLUE SHIELD | Source: Ambulatory Visit | Attending: Family Medicine | Admitting: Family Medicine

## 2019-02-13 VITALS — Temp 97.6°F | Ht 63.0 in | Wt 156.4 lb

## 2019-02-13 DIAGNOSIS — R109 Unspecified abdominal pain: Secondary | ICD-10-CM | POA: Diagnosis not present

## 2019-02-13 DIAGNOSIS — R300 Dysuria: Secondary | ICD-10-CM | POA: Diagnosis not present

## 2019-02-13 LAB — HEPATIC FUNCTION PANEL
ALT: 15 U/L (ref 0–44)
AST: 20 U/L (ref 15–41)
Albumin: 4.6 g/dL (ref 3.5–5.0)
Alkaline Phosphatase: 56 U/L (ref 38–126)
Bilirubin, Direct: 0.1 mg/dL (ref 0.0–0.2)
Indirect Bilirubin: 0.7 mg/dL (ref 0.3–0.9)
Total Bilirubin: 0.8 mg/dL (ref 0.3–1.2)
Total Protein: 8.1 g/dL (ref 6.5–8.1)

## 2019-02-13 LAB — BASIC METABOLIC PANEL
Anion gap: 9 (ref 5–15)
BUN: 17 mg/dL (ref 6–20)
CO2: 25 mmol/L (ref 22–32)
CREATININE: 0.71 mg/dL (ref 0.44–1.00)
Calcium: 8.9 mg/dL (ref 8.9–10.3)
Chloride: 101 mmol/L (ref 98–111)
GFR calc Af Amer: >60 mL/min (ref >60)
GFR calc non Af Amer: >60 mL/min (ref >60)
Glucose, Bld: 95 mg/dL (ref 70–99)
Potassium: 4.1 mmol/L (ref 3.5–5.1)
Sodium: 135 mmol/L (ref 135–145)

## 2019-02-13 LAB — CBC WITH DIFFERENTIAL/PLATELET
Abs Immature Granulocytes: 0.02 10*3/uL (ref 0.00–0.07)
BASOS PCT: 1 %
Basophils Absolute: 0 10*3/uL (ref 0.0–0.1)
EOS ABS: 0.1 10*3/uL (ref 0.0–0.5)
Eosinophils Relative: 1 %
HCT: 42.9 % (ref 36.0–46.0)
Hemoglobin: 13.8 g/dL (ref 12.0–15.0)
Immature Granulocytes: 0 %
Lymphocytes Relative: 38 %
Lymphs Abs: 2.9 10*3/uL (ref 0.7–4.0)
MCH: 29.1 pg (ref 26.0–34.0)
MCHC: 32.2 g/dL (ref 30.0–36.0)
MCV: 90.5 fL (ref 80.0–100.0)
Monocytes Absolute: 0.4 10*3/uL (ref 0.1–1.0)
Monocytes Relative: 6 %
Neutro Abs: 4.1 10*3/uL (ref 1.7–7.7)
Neutrophils Relative %: 54 %
Platelets: 297 10*3/uL (ref 150–400)
RBC: 4.74 MIL/uL (ref 3.87–5.11)
RDW: 12.3 % (ref 11.5–15.5)
WBC: 7.6 10*3/uL (ref 4.0–10.5)
nRBC: 0 % (ref 0.0–0.2)

## 2019-02-13 LAB — POCT URINALYSIS DIPSTICK: PH UA: 5 (ref 5.0–8.0)

## 2019-02-13 LAB — AMYLASE: AMYLASE: 94 U/L (ref 28–100)

## 2019-02-13 LAB — LIPASE, BLOOD: Lipase: 42 U/L (ref 11–51)

## 2019-02-13 MED ORDER — ONDANSETRON 4 MG PO TBDP: ORAL_TABLET | ORAL | 0 refills | Status: DC

## 2019-02-13 NOTE — Progress Notes (Addendum)
Subjective:    Patient ID: [name], female    DOB: 08/11/1976, 43 y.o.   MRN: [id number]  HPI  Patient arrives with dysuria since Thursday. Patient states she has discomfort in her lower abdomen and feels bloated.  Results for orders placed or performed in visit on 02/13/19  POCT urinalysis dipstick  Result Value Ref Range   Color, UA     Clarity, UA     Glucose, UA     Bilirubin, UA     Ketones, UA     Spec Grav, UA <=1.005 (A) 1.010 - 1.025   Blood, UA trace    pH, UA 5.0 5.0 - 8.0   Protein, UA     Urobilinogen, UA     Nitrite, UA     Leukocytes, UA     Appearance     Odor     Around Tuesday felt tired  No obv  Burning with urination  No prob with    opartial hysterectmy  No cough no cong   Appetite not as good  Not as hundgry +  abd pai  Low abd discomfort This morn   No diarrhea some nausea yest erday    Review of Systems No headache, no major weight loss or weight gain, no chest pain no change in bowel habits complete ROS otherwise negative     Objective:   Physical Exam Alert and oriented, vitals reviewed and stable, NAD ENT-TM's and ext canals WNL bilat via otoscopic exam Soft palate, tonsils and post pharynx WNL via oropharyngeal exam Neck-symmetric, no masses; thyroid nonpalpable and nontender Pulmonary-no tachypnea or accessory muscle use; Clear without wheezes via auscultation Card--no abnrml murmurs, rhythm reg and rate WNL Carotid pulses symmetric, without bruits Abdomen diffuse lower abdominal tenderness.  Fairly significant with palpation.  Some epigastric tenderness.  No rebound or guarding.  Urinalysis unremarkable  Sent for blood work  Results for orders placed or performed in visit on 02/13/19  POCT urinalysis dipstick  Result Value Ref Range   Color, UA     Clarity, UA     Glucose, UA     Bilirubin, UA     Ketones, UA     Spec Grav, UA <=1.005 (A) 1.010 - 1.025   Blood, UA trace    pH, UA 5.0 5.0 - 8.0   Protein, UA     Urobilinogen, UA     Nitrite, UA     Leukocytes, UA     Appearance     Odor     Recent Results (from the past 2160 hour(s))  POCT urinalysis dipstick     Status: Abnormal   Collection Time: 02/13/19 11:40 AM  Result Value Ref Range   Color, UA     Clarity, UA     Glucose, UA     Bilirubin, UA     Ketones, UA     Spec Grav, UA <=1.005 (A) 1.010 - 1.025   Blood, UA trace    pH, UA 5.0 5.0 - 8.0   Protein, UA     Urobilinogen, UA     Nitrite, UA     Leukocytes, UA     Appearance     Odor    Amylase     Status: None   Collection Time: 02/13/19 12:34 PM  Result Value Ref Range   Amylase 94 28 - 100 U/L    Comment: Performed at [facility], [address] [place], [place] [address]  Lipase, blood  Status: None   Collection Time: 02/13/19 12:34 PM  Result Value Ref Range   Lipase 42 11 - 51 U/L    Comment: Performed at [facility], [address] [place], [place] [address]  CBC with Differential/Platelet     Status: None   Collection Time: 02/13/19 12:34 PM  Result Value Ref Range   WBC 7.6 4.0 - 10.5 K/uL   RBC 4.74 3.87 - 5.11 MIL/uL   Hemoglobin 13.8 12.0 - 15.0 g/dL   HCT 43.3 29.5 - 18.8 %   MCV 90.5 80.0 - 100.0 fL   MCH 29.1 26.0 - 34.0 pg   MCHC 32.2 30.0 - 36.0 g/dL   RDW 41.6 60.6 - 30.1 %   Platelets 297 150 - 400 K/uL   nRBC 0.0 0.0 - 0.2 %   Neutrophils Relative % 54 %   Neutro Abs 4.1 1.7 - 7.7 K/uL   Lymphocytes Relative 38 %   Lymphs Abs 2.9 0.7 - 4.0 K/uL   Monocytes Relative 6 %   Monocytes Absolute 0.4 0.1 - 1.0 K/uL   Eosinophils Relative 1 %   Eosinophils Absolute 0.1 0.0 - 0.5 K/uL   Basophils Relative 1 %   Basophils Absolute 0.0 0.0 - 0.1 K/uL   Immature Granulocytes 0 %   Abs Immature Granulocytes 0.02 0.00 - 0.07 K/uL    Comment: Performed at [facility], [address] [place], [place] [address]  Basic metabolic panel     Status: None   Collection Time: 02/13/19 12:34 PM  Result Value Ref Range   Sodium 135 135  - 145 mmol/L   Potassium 4.1 3.5 - 5.1 mmol/L   Chloride 101 98 - 111 mmol/L   CO2 25 22 - 32 mmol/L   Glucose, Bld 95 70 - 99 mg/dL   BUN 17 6 - 20 mg/dL   Creatinine, Ser 3.23 0.44 - 1.00 mg/dL   Calcium 8.9 8.9 - 55.7 mg/dL   GFR calc non Af Amer >60 >60 mL/min   GFR calc Af Amer >60 >60 mL/min   Anion gap 9 5 - 15    Comment: Performed at [facility], [address], [place], [place] [address]  Hepatic function panel     Status: None   Collection Time: 02/13/19 12:34 PM  Result Value Ref Range   Total Protein 8.1 6.5 - 8.1 g/dL   Albumin 4.6 3.5 - 5.0 g/dL   AST 20 15 - 41 U/L   ALT 15 0 - 44 U/L   Alkaline Phosphatase 56 38 - 126 U/L   Total Bilirubin 0.8 0.3 - 1.2 mg/dL   Bilirubin, Direct 0.1 0.0 - 0.2 mg/dL   Indirect Bilirubin 0.7 0.3 - 0.9 mg/dL    Comment: Performed at [facility], [address] [place], [place] [address]        Assessment & Plan:  Impression #1 abdominal pain.  Stat blood work ordered.  Rationale discussed with patient.  Blood work all negative.  Urinalysis negative.  Symptom care discussed.  Warning signs discussed.  May well represent viral syndrome.  Zofran as needed.  Greater than 50% of this 25 minute face to face visit was spent in counseling and discussion and coordination of care regarding the above diagnosis/diagnosies

## 2019-08-26 IMAGING — DX DG HIP (WITH OR WITHOUT PELVIS) 2-3V*L*
3 series · 3 of 3 positions shown · non-contrast
Comparison: None.

CLINICAL DATA: Left hip pain, no injury

EXAM:
DG HIP (WITH OR WITHOUT PELVIS) 2-3V LEFT

[pelvis ap]
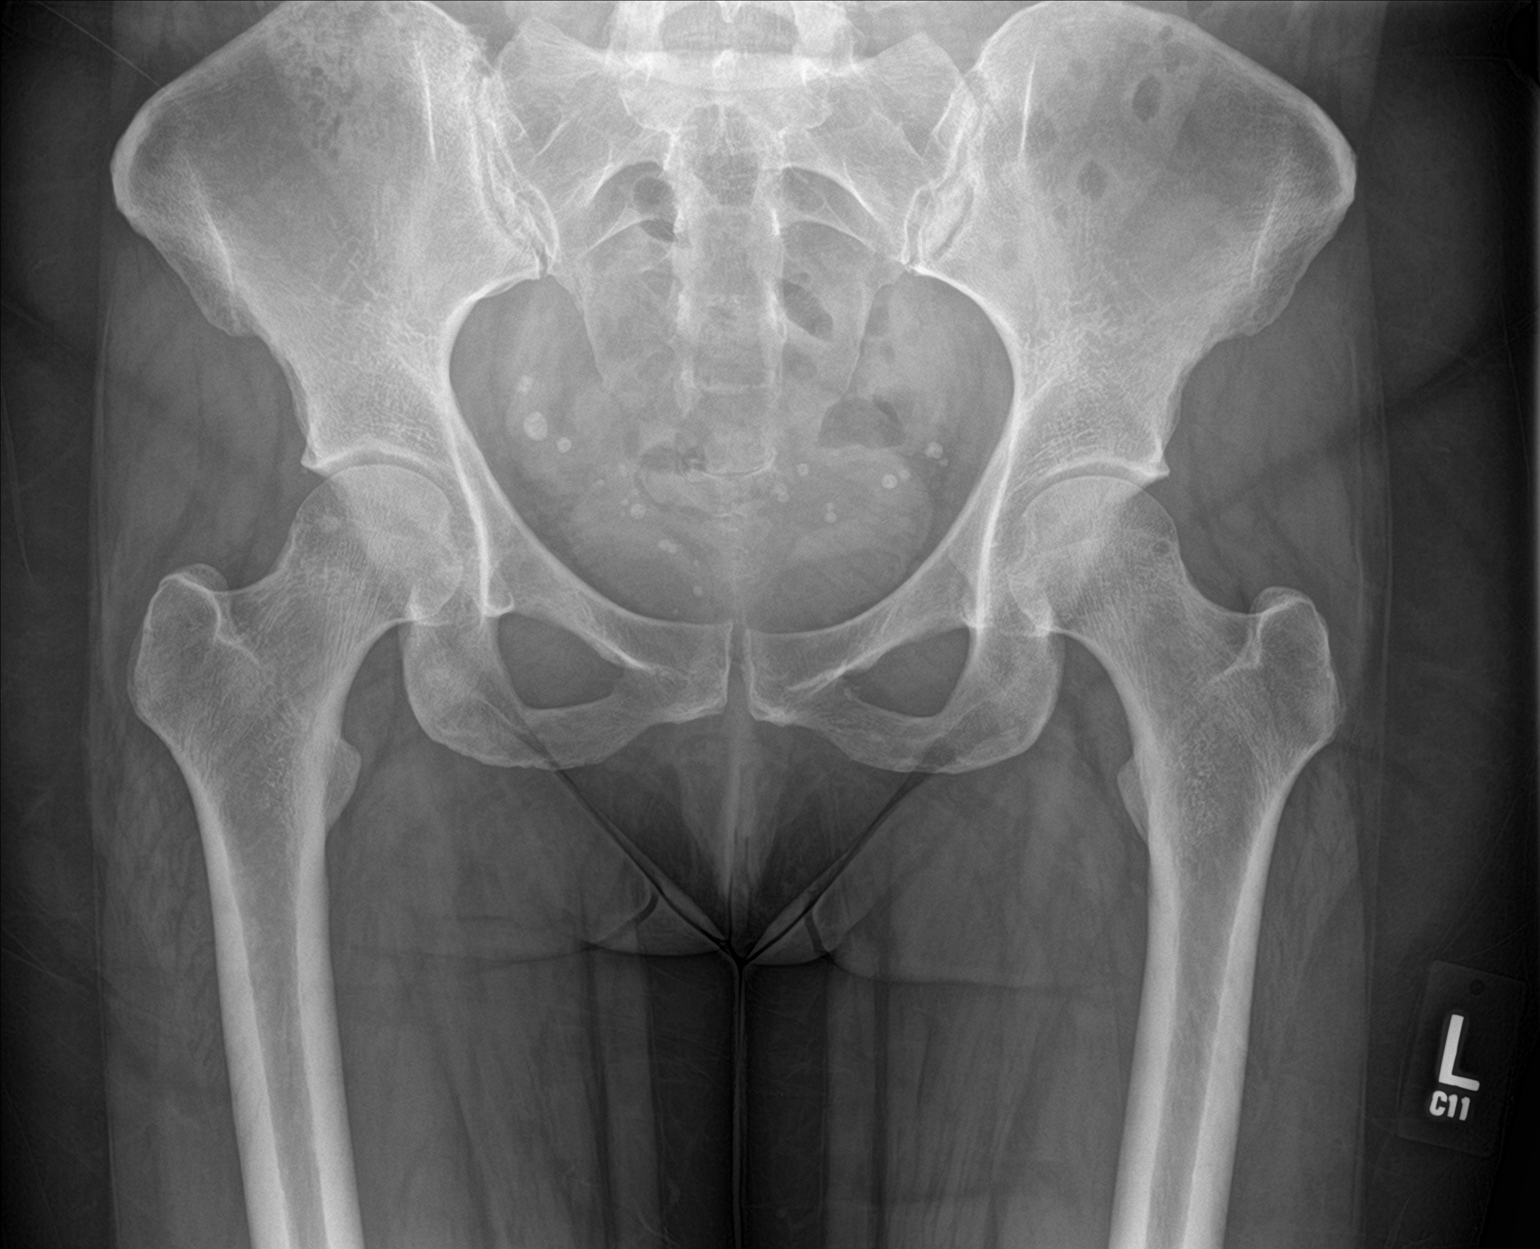

[hip ap]
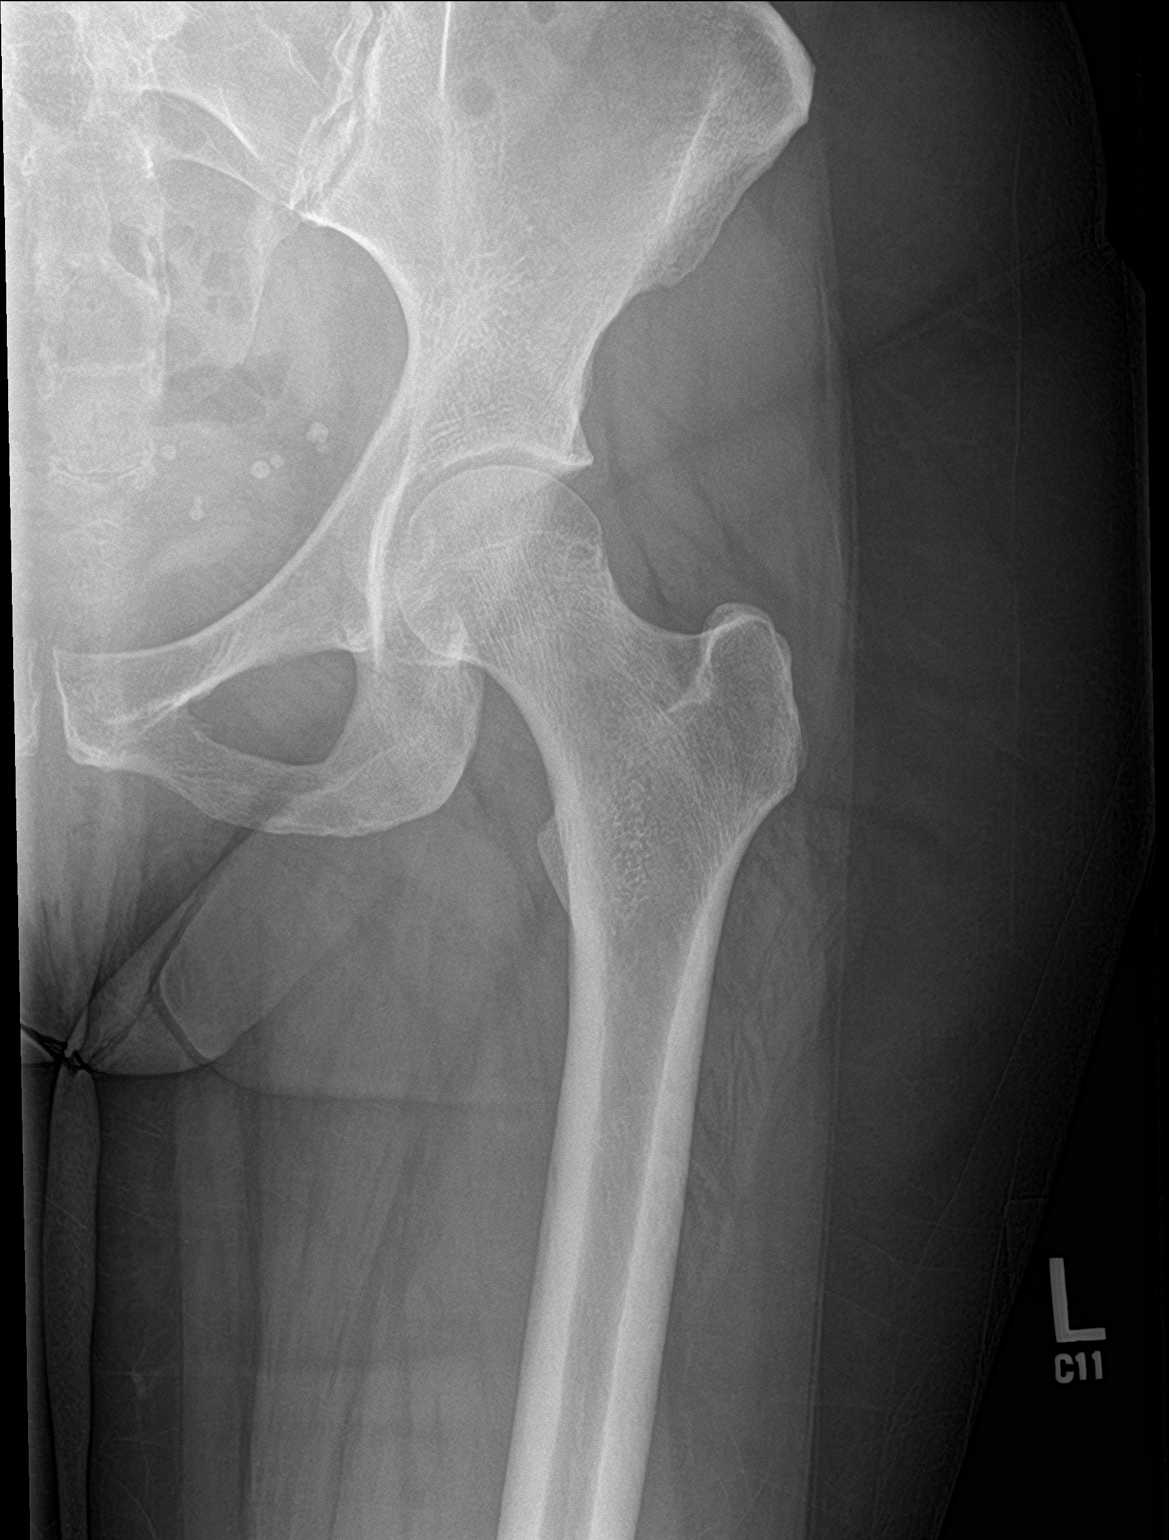

[hip lat]
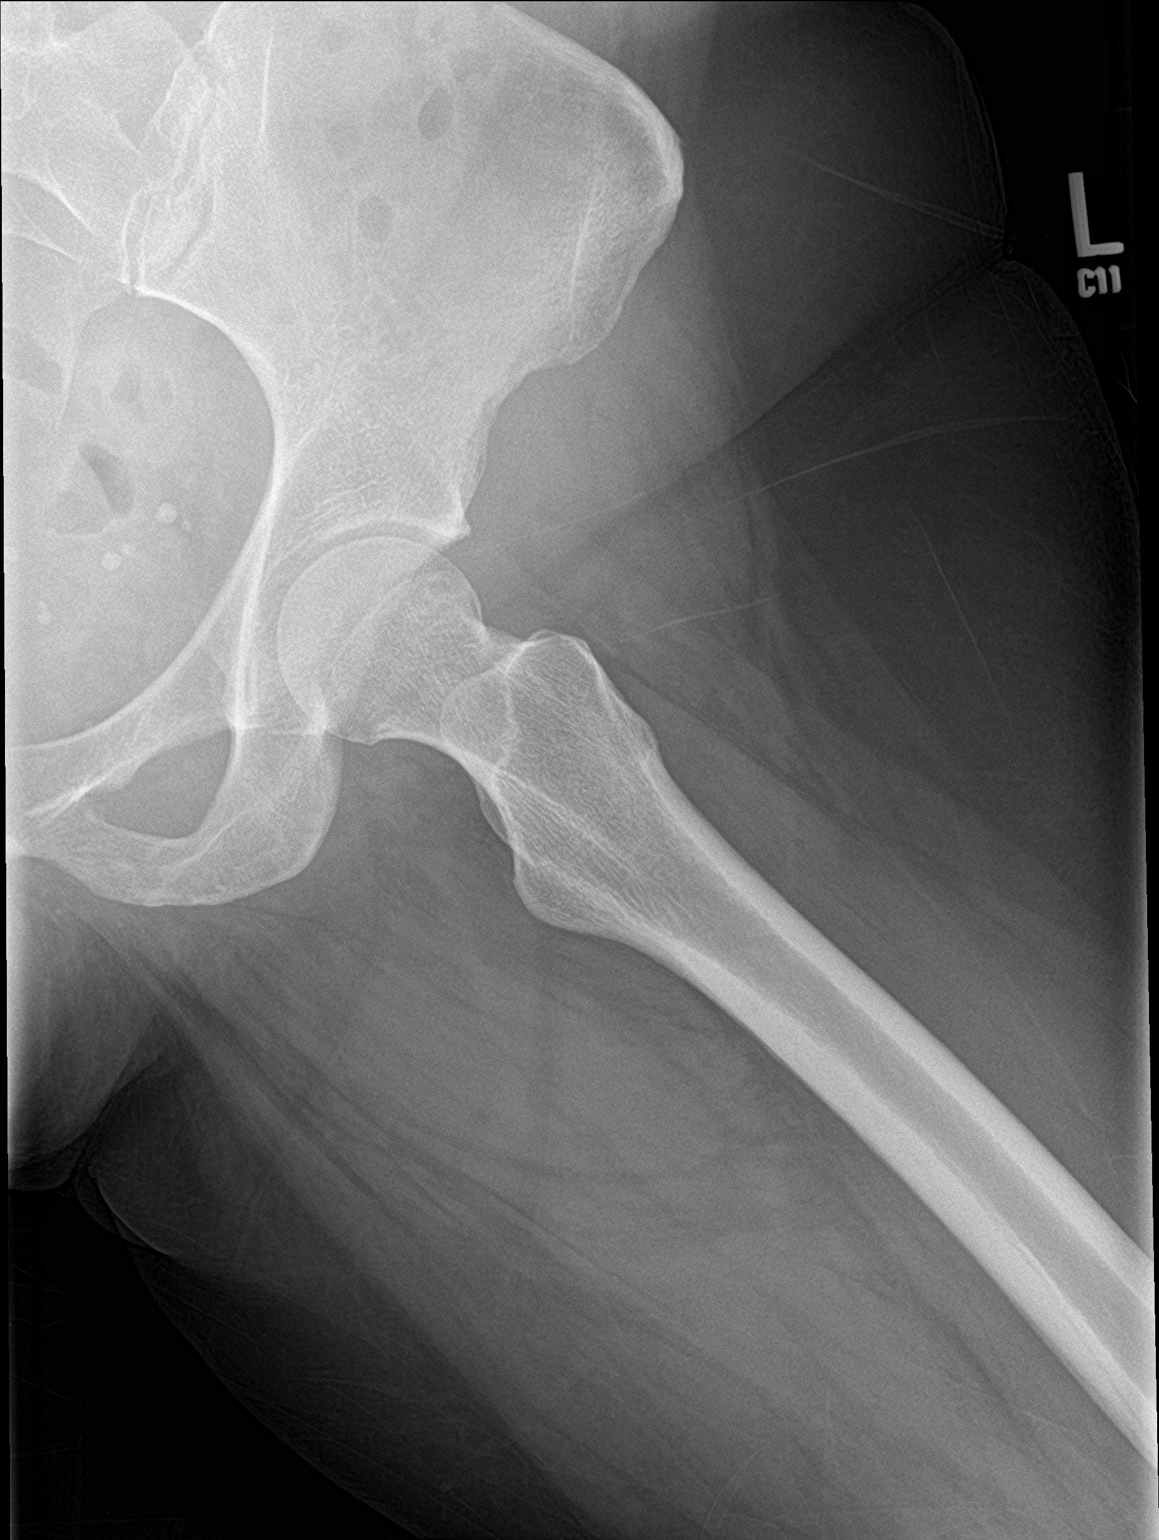

[3 of 3 positions shown; findings below may reference images not displayed]

FINDINGS: Both hips are normally positioned with no significant degenerative
joint disease. No fracture is seen. The pelvic rami are intact. The
SI joints appear corticated
IMPRESSION: Negative.

## 2020-01-08 DIAGNOSIS — Z1231 Encounter for screening mammogram for malignant neoplasm of breast: Secondary | ICD-10-CM | POA: Diagnosis not present

## 2020-01-08 DIAGNOSIS — Z01419 Encounter for gynecological examination (general) (routine) without abnormal findings: Secondary | ICD-10-CM | POA: Diagnosis not present

## 2020-01-08 DIAGNOSIS — Z6827 Body mass index (BMI) 27.0-27.9, adult: Secondary | ICD-10-CM | POA: Diagnosis not present

## 2020-01-19 ENCOUNTER — Encounter: Payer: Self-pay | Admitting: Family Medicine

## 2020-11-08 ENCOUNTER — Ambulatory Visit (INDEPENDENT_AMBULATORY_CARE_PROVIDER_SITE_OTHER): Payer: BC Managed Care – PPO | Admitting: Family Medicine

## 2020-11-08 ENCOUNTER — Other Ambulatory Visit: Payer: Self-pay

## 2020-11-08 ENCOUNTER — Encounter: Payer: Self-pay | Admitting: Family Medicine

## 2020-11-08 VITALS — HR 98 | Temp 97.9°F | Resp 16

## 2020-11-08 DIAGNOSIS — R059 Cough, unspecified: Secondary | ICD-10-CM

## 2020-11-08 MED ORDER — PREDNISONE 20 MG PO TABS: 20.0000 mg | ORAL_TABLET | Freq: Every day | ORAL | 0 refills | Status: DC

## 2020-11-08 MED ORDER — BENZONATATE 100 MG PO CAPS: 100.0000 mg | ORAL_CAPSULE | Freq: Two times a day (BID) | ORAL | 0 refills | Status: DC | PRN

## 2020-11-08 NOTE — Progress Notes (Signed)
Pt began having sinus issues about one week ago. That has improved. Pt is now having cough.     Patient ID: [name], female    DOB: 10-Dec-1976, 44 y.o.   MRN: [id number]   Chief Complaint  Patient presents with  . Cough   Subjective:  CC: cough and congestion  Presents today with a complaint of cough and congestion.  Reports that she had a sinus infection last week she did not get that treated, she feels no more pain from this.  Symptoms started on Friday and have worsened.  Cough is keeping her up at night.  She was coughing frequently during the interview.  She denies fever, chills, shortness of breath, headache, ear pain, abdominal pain, urinary symptoms.    Medical History [name] has no past medical history on file.   Outpatient Encounter Medications as of 11/08/2020  Medication Sig  . benzonatate (TESSALON) 100 MG capsule Take 1 capsule (100 mg total) by mouth 2 (two) times daily as needed for cough.  . predniSONE (DELTASONE) 20 MG tablet Take 1 tablet (20 mg total) by mouth daily with breakfast.  . [DISCONTINUED] etodolac (LODINE) 400 MG tablet Take one bid As needed for pain. Take with food.  . [DISCONTINUED] ondansetron (ZOFRAN ODT) 4 MG disintegrating tablet One Sl Q 6 hours prn   No facility-administered encounter medications on file as of 11/08/2020.     Review of Systems  Constitutional: Negative for chills and fever.  HENT: Positive for congestion, postnasal drip and rhinorrhea. Negative for ear pain, sinus pressure, sinus pain, sneezing and sore throat.   Respiratory: Positive for cough. Negative for shortness of breath.   Cardiovascular: Negative for chest pain.  Gastrointestinal: Negative for abdominal pain.  Genitourinary: Negative for dysuria.  Neurological: Negative for headaches.     Vitals Pulse 98   Temp 97.9 F (36.6 C)   Resp 16   SpO2 96%   Objective:   Physical Exam Vitals and nursing note reviewed.  Constitutional:      General:  She is not in acute distress.    Appearance: She is ill-appearing. She is not toxic-appearing.  HENT:     Right Ear: Tympanic membrane normal.     Left Ear: Tympanic membrane normal.     Nose: Rhinorrhea present.     Mouth/Throat:     Mouth: Mucous membranes are moist.     Pharynx: Oropharynx is clear. No posterior oropharyngeal erythema.  Cardiovascular:     Rate and Rhythm: Normal rate and regular rhythm.     Pulses: Normal pulses.     Heart sounds: Normal heart sounds.  Pulmonary:     Effort: Pulmonary effort is normal.     Breath sounds: Normal breath sounds.     Comments: Coughing throughout exam. Skin:    General: Skin is warm and dry.  Neurological:     General: No focal deficit present.     Mental Status: She is alert and oriented to person, place, and time.  Psychiatric:        Mood and Affect: Mood normal.        Behavior: Behavior normal.        Thought Content: Thought content normal.        Judgment: Judgment normal.      Assessment and Plan   1. Cough in adult - benzonatate (TESSALON) 100 MG capsule; Take 1 capsule (100 mg total) by mouth 2 (two) times daily as needed for cough.  Dispense:  20 capsule; Refill: 0 - predniSONE (DELTASONE) 20 MG tablet; Take 1 tablet (20 mg total) by mouth daily with breakfast.  Dispense: 5 tablet; Refill: 0   Cough present since Friday, worse now.  Keeping her up at night.  Has not run a fever reports that she recovered from a sinus infection last week now with lingering cough.  Will treat with cough medicine, and prednisone for 5 days.  She questioned whether or not she should get an antibiotic, I do not feel that is indicated at this time.  She is completely recovered from her sinus infection.  She did agree to a Covid test that is pending.  Agrees with plan of care discussed today. Understands warning signs to seek further care: Fever, chest pain, shortness of breath, any significant change in health status. Understands to  follow-up if symptoms do not resolve.  We will notify her once the Covid results are available.  She will let us know if her symptoms do not improve.  Michelle Olive, NP 11/08/2020

## 2020-11-08 NOTE — Addendum Note (Signed)
Addended by: Marlowe Shores on: 11/08/2020 04:23 PM   Modules accepted: Orders

## 2020-11-09 ENCOUNTER — Ambulatory Visit: Payer: BLUE CROSS/BLUE SHIELD | Admitting: Family Medicine

## 2020-11-10 LAB — SARS-COV-2, NAA 2 DAY TAT

## 2020-11-10 LAB — NOVEL CORONAVIRUS, NAA: SARS-CoV-2, NAA: NOT DETECTED

## 2020-12-24 ENCOUNTER — Other Ambulatory Visit: Payer: Self-pay

## 2020-12-24 ENCOUNTER — Ambulatory Visit (INDEPENDENT_AMBULATORY_CARE_PROVIDER_SITE_OTHER): Payer: BC Managed Care – PPO | Admitting: Family Medicine

## 2020-12-24 ENCOUNTER — Encounter: Payer: Self-pay | Admitting: Family Medicine

## 2020-12-24 VITALS — BP 118/86 | Temp 98.1°F | Ht 63.0 in | Wt 157.0 lb

## 2020-12-24 DIAGNOSIS — L299 Pruritus, unspecified: Secondary | ICD-10-CM | POA: Diagnosis not present

## 2020-12-24 MED ORDER — HYDROCORTISONE 1 % EX LOTN: 1.0000 "application " | TOPICAL_LOTION | Freq: Two times a day (BID) | CUTANEOUS | 1 refills | Status: AC

## 2020-12-24 NOTE — Progress Notes (Signed)
Patient ID: [name], female    DOB: 03-21-1976, 45 y.o.   MRN: [id number]   Chief Complaint  Patient presents with  . Pruritis   Subjective:  CC: itching both legs and stomach  This is a new problem.  Presents today with a complaint of itching on both legs and her stomach.  There is no visible rash, symptoms have been present for 1 month or more, has tried moisturizer cream lotions, over-the-counter hydrocortisone cream.  Denies any different soaps,  no new meds, no travel no drug or alcohol use, no smoking, no other family members have this issues, and no abdominal pain, and no jaundice.  She is sexually active, monogamous same partner.     itching on both legs and sometimes stomach. Tried everything otc. Diagnosed with fibromyalgia in 2013 and wonders if this is related.    Medical History [name] has no past medical history on file.   Outpatient Encounter Medications as of 12/24/2020  Medication Sig  . hydrocortisone 1 % lotion Apply 1 application topically 2 (two) times daily.  . [DISCONTINUED] benzonatate (TESSALON) 100 MG capsule Take 1 capsule (100 mg total) by mouth 2 (two) times daily as needed for cough.  . [DISCONTINUED] predniSONE (DELTASONE) 20 MG tablet Take 1 tablet (20 mg total) by mouth daily with breakfast.   No facility-administered encounter medications on file as of 12/24/2020.     Review of Systems  Constitutional: Negative for appetite change, chills, fatigue, fever and unexpected weight change.  HENT: Negative for congestion and ear pain.   Eyes: Negative for pain.  Respiratory: Negative for cough, chest tightness and shortness of breath.   Cardiovascular: Negative for chest pain and leg swelling.  Gastrointestinal: Negative for abdominal pain, blood in stool, diarrhea, nausea and vomiting.  Endocrine: Negative for polydipsia and polyuria.  Genitourinary: Negative for dysuria, flank pain, hematuria and vaginal bleeding.  Musculoskeletal: Negative  for arthralgias, joint swelling and myalgias.  Skin: Negative for color change and rash.       Itching both legs and sometimes abdomen. Severe for one month.   Neurological: Negative for dizziness, weakness and headaches.  Hematological: Negative for adenopathy.     Vitals BP 118/86   Temp 98.1 F (36.7 C)   Ht _0  (1.6 m)   Wt 157 lb (71.2 kg)   SpO2 97%   BMI 27.81 kg/m         Objective:   Physical Exam Vitals reviewed.  Constitutional:      Appearance: Normal appearance.  Cardiovascular:     Rate and Rhythm: Normal rate and regular rhythm.     Heart sounds: Normal heart sounds.  Pulmonary:     Effort: Pulmonary effort is normal.     Breath sounds: Normal breath sounds.  Skin:    General: Skin is warm and dry.     Comments: No visible rash.  Neurological:     General: No focal deficit present.     Mental Status: She is alert.  Psychiatric:        Behavior: Behavior normal.      Assessment and Plan   1. Pruritus - hydrocortisone 1 % lotion; Apply 1 application topically 2 (two) times daily.  Dispense: 118 mL; Refill: 1 - CBC with Differential - Comprehensive Metabolic Panel (CMET) - TSH - Sed Rate (ESR) - HIV antibody (with reflex)    Pruritus is broad complaint.  Differential diagnosis include infectious, malignancy, allergic reaction, obstruction, thyroid, etc.  Has not had labs in a while, will get labs today on the way out.  Is getting her COVID booster shot soon, will consider prednisone after 2 weeks if symptoms persist.  She will try hydrocortisone cream 1% twice per day for now.   Agrees with plan of care discussed today. Understands warning signs to seek further care: Chest pain, shortness of breath, any significant change in health. Understands to follow-up in 1 month, sooner if anything changes.  Will notify once lab results are available, and follow-up can be addressed at that time if it needs to be sooner.  We will consider referral to  dermatology, allergist depending on what lab work shows.   Michelle Ades, FNP-C 12/24/2020

## 2020-12-24 NOTE — Patient Instructions (Signed)
Use lotion as directed. Get labs today and we will notify once we get results.

## 2020-12-25 LAB — COMPREHENSIVE METABOLIC PANEL
ALT: 14 IU/L (ref 0–32)
AST: 17 IU/L (ref 0–40)
Albumin/Globulin Ratio: 1.6 (ref 1.2–2.2)
Albumin: 4.7 g/dL (ref 3.8–4.8)
Alkaline Phosphatase: 67 IU/L (ref 44–121)
BUN/Creatinine Ratio: 20 (ref 9–23)
BUN: 15 mg/dL (ref 6–24)
Bilirubin Total: 0.3 mg/dL (ref 0.0–1.2)
CO2: 22 mmol/L (ref 20–29)
Calcium: 9.3 mg/dL (ref 8.7–10.2)
Chloride: 102 mmol/L (ref 96–106)
Creatinine, Ser: 0.75 mg/dL (ref 0.57–1.00)
GFR calc Af Amer: 111 mL/min/{1.73_m2} (ref >59)
GFR calc non Af Amer: 97 mL/min/{1.73_m2} (ref >59)
Globulin, Total: 2.9 g/dL (ref 1.5–4.5)
Glucose: 107 mg/dL — ABNORMAL HIGH (ref 65–99)
Potassium: 4.5 mmol/L (ref 3.5–5.2)
Sodium: 138 mmol/L (ref 134–144)
Total Protein: 7.6 g/dL (ref 6.0–8.5)

## 2020-12-25 LAB — CBC WITH DIFFERENTIAL/PLATELET
Basophils Absolute: 0.1 10*3/uL (ref 0.0–0.2)
Basos: 1 %
EOS (ABSOLUTE): 0.1 10*3/uL (ref 0.0–0.4)
Eos: 1 %
Hematocrit: 40.7 % (ref 34.0–46.6)
Hemoglobin: 14.2 g/dL (ref 11.1–15.9)
Immature Grans (Abs): 0 10*3/uL (ref 0.0–0.1)
Immature Granulocytes: 0 %
Lymphocytes Absolute: 1.8 10*3/uL (ref 0.7–3.1)
Lymphs: 26 %
MCH: 30.9 pg (ref 26.6–33.0)
MCHC: 34.9 g/dL (ref 31.5–35.7)
MCV: 89 fL (ref 79–97)
Monocytes Absolute: 0.5 10*3/uL (ref 0.1–0.9)
Monocytes: 8 %
Neutrophils Absolute: 4.3 10*3/uL (ref 1.4–7.0)
Neutrophils: 64 %
Platelets: 343 10*3/uL (ref 150–450)
RBC: 4.6 x10E6/uL (ref 3.77–5.28)
RDW: 11.8 % (ref 11.7–15.4)
WBC: 6.7 10*3/uL (ref 3.4–10.8)

## 2020-12-25 LAB — TSH: TSH: 1.09 u[IU]/mL (ref 0.450–4.500)

## 2020-12-25 LAB — SEDIMENTATION RATE: Sed Rate: 15 mm/hr (ref 0–32)

## 2020-12-25 LAB — HIV ANTIBODY (ROUTINE TESTING W REFLEX): HIV Screen 4th Generation wRfx: NONREACTIVE

## 2021-01-24 ENCOUNTER — Encounter: Payer: BC Managed Care – PPO | Admitting: Family Medicine

## 2021-02-24 DIAGNOSIS — Z1231 Encounter for screening mammogram for malignant neoplasm of breast: Secondary | ICD-10-CM | POA: Diagnosis not present

## 2021-02-24 DIAGNOSIS — Z01419 Encounter for gynecological examination (general) (routine) without abnormal findings: Secondary | ICD-10-CM | POA: Diagnosis not present

## 2021-02-24 DIAGNOSIS — Z6828 Body mass index (BMI) 28.0-28.9, adult: Secondary | ICD-10-CM | POA: Diagnosis not present

## 2023-04-23 ENCOUNTER — Other Ambulatory Visit: Payer: Self-pay | Admitting: Diagnostic Radiology

## 2023-04-23 DIAGNOSIS — R1011 Right upper quadrant pain: Secondary | ICD-10-CM

## 2023-07-25 NOTE — Progress Notes (Signed)
This encounter was created in error - please disregard.
